# Patient Record
Sex: Female | Born: 1940 | Race: White | Hispanic: No | Marital: Married | Smoking: Never smoker
Health system: Southern US, Community
[De-identification: ages and names within clinical notes are randomized; demographics above are authoritative.]

## PROBLEM LIST (undated history)

## (undated) DIAGNOSIS — S72009A Fracture of unspecified part of neck of unspecified femur, initial encounter for closed fracture: Secondary | ICD-10-CM

## (undated) DIAGNOSIS — J80 Acute respiratory distress syndrome: Secondary | ICD-10-CM

## (undated) DIAGNOSIS — E119 Type 2 diabetes mellitus without complications: Secondary | ICD-10-CM

## (undated) DIAGNOSIS — J189 Pneumonia, unspecified organism: Secondary | ICD-10-CM

## (undated) DIAGNOSIS — A419 Sepsis, unspecified organism: Secondary | ICD-10-CM

## (undated) DIAGNOSIS — N183 Chronic kidney disease, stage 3 unspecified: Secondary | ICD-10-CM

## (undated) DIAGNOSIS — I10 Essential (primary) hypertension: Secondary | ICD-10-CM

## (undated) DIAGNOSIS — J9621 Acute and chronic respiratory failure with hypoxia: Secondary | ICD-10-CM

## (undated) DIAGNOSIS — S2239XA Fracture of one rib, unspecified side, initial encounter for closed fracture: Secondary | ICD-10-CM

## (undated) HISTORY — PX: RIB FRACTURE SURGERY: SHX2358

## (undated) HISTORY — PX: HIP FRACTURE SURGERY: SHX118

## (undated) HISTORY — PX: OTHER SURGICAL HISTORY: SHX169

---

## 2004-03-04 ENCOUNTER — Emergency Department (HOSPITAL_COMMUNITY): Admission: EM | Admit: 2004-03-04 | Discharge: 2004-03-04 | Payer: Self-pay | Admitting: Emergency Medicine

## 2017-10-26 ENCOUNTER — Other Ambulatory Visit (HOSPITAL_COMMUNITY): Payer: Self-pay

## 2017-10-26 ENCOUNTER — Inpatient Hospital Stay
Admission: AD | Admit: 2017-10-26 | Discharge: 2017-12-07 | Disposition: E | Payer: Medicare Other | Source: Ambulatory Visit | Attending: Internal Medicine | Admitting: Internal Medicine

## 2017-10-26 ENCOUNTER — Ambulatory Visit (HOSPITAL_COMMUNITY)
Admission: AD | Admit: 2017-10-26 | Discharge: 2017-10-26 | Disposition: A | Discharge status: Home / Self Care | Payer: Self-pay | Source: Other Acute Inpatient Hospital | Attending: Internal Medicine | Admitting: Internal Medicine

## 2017-10-26 DIAGNOSIS — Z0189 Encounter for other specified special examinations: Secondary | ICD-10-CM

## 2017-10-26 DIAGNOSIS — J9690 Respiratory failure, unspecified, unspecified whether with hypoxia or hypercapnia: Secondary | ICD-10-CM | POA: Insufficient documentation

## 2017-10-26 DIAGNOSIS — R140 Abdominal distension (gaseous): Secondary | ICD-10-CM

## 2017-10-26 DIAGNOSIS — J9621 Acute and chronic respiratory failure with hypoxia: Secondary | ICD-10-CM

## 2017-10-26 DIAGNOSIS — J811 Chronic pulmonary edema: Secondary | ICD-10-CM

## 2017-10-26 DIAGNOSIS — N183 Chronic kidney disease, stage 3 unspecified: Secondary | ICD-10-CM | POA: Diagnosis present

## 2017-10-26 DIAGNOSIS — K567 Ileus, unspecified: Secondary | ICD-10-CM

## 2017-10-26 DIAGNOSIS — J189 Pneumonia, unspecified organism: Secondary | ICD-10-CM

## 2017-10-26 DIAGNOSIS — J80 Acute respiratory distress syndrome: Secondary | ICD-10-CM

## 2017-10-26 DIAGNOSIS — A419 Sepsis, unspecified organism: Secondary | ICD-10-CM | POA: Diagnosis present

## 2017-10-26 DIAGNOSIS — Z931 Gastrostomy status: Secondary | ICD-10-CM

## 2017-10-26 HISTORY — DX: Sepsis, unspecified organism: A41.9

## 2017-10-26 HISTORY — DX: Essential (primary) hypertension: I10

## 2017-10-26 HISTORY — DX: Fracture of unspecified part of neck of unspecified femur, initial encounter for closed fracture: S72.009A

## 2017-10-26 HISTORY — DX: Pneumonia, unspecified organism: J18.9

## 2017-10-26 HISTORY — DX: Chronic kidney disease, stage 3 unspecified: N18.30

## 2017-10-26 HISTORY — DX: Fracture of one rib, unspecified side, initial encounter for closed fracture: S22.39XA

## 2017-10-26 HISTORY — DX: Acute respiratory distress syndrome: J80

## 2017-10-26 HISTORY — DX: Type 2 diabetes mellitus without complications: E11.9

## 2017-10-26 HISTORY — DX: Acute and chronic respiratory failure with hypoxia: J96.21

## 2017-10-26 HISTORY — DX: Chronic kidney disease, stage 3 (moderate): N18.3

## 2017-10-26 LAB — BLOOD GAS, ARTERIAL
Acid-Base Excess: 0.6 mmol/L (ref 0.0–2.0)
BICARBONATE: 26.2 mmol/L (ref 20.0–28.0)
FIO2: 50
LHR: 18 {breaths}/min
O2 SAT: 85.6 %
PATIENT TEMPERATURE: 98.6
PEEP: 5 cmH2O
PH ART: 7.309 — AB (ref 7.350–7.450)
VT: 400 mL
pCO2 arterial: 53.8 mmHg — ABNORMAL HIGH (ref 32.0–48.0)
pO2, Arterial: 52.1 mmHg — ABNORMAL LOW (ref 83.0–108.0)

## 2017-10-27 ENCOUNTER — Encounter: Payer: Self-pay | Admitting: Internal Medicine

## 2017-10-27 DIAGNOSIS — A419 Sepsis, unspecified organism: Secondary | ICD-10-CM | POA: Diagnosis not present

## 2017-10-27 DIAGNOSIS — J189 Pneumonia, unspecified organism: Secondary | ICD-10-CM | POA: Diagnosis not present

## 2017-10-27 DIAGNOSIS — N183 Chronic kidney disease, stage 3 unspecified: Secondary | ICD-10-CM | POA: Diagnosis present

## 2017-10-27 DIAGNOSIS — J80 Acute respiratory distress syndrome: Secondary | ICD-10-CM | POA: Diagnosis not present

## 2017-10-27 DIAGNOSIS — J9621 Acute and chronic respiratory failure with hypoxia: Secondary | ICD-10-CM | POA: Diagnosis present

## 2017-10-27 LAB — URINALYSIS, ROUTINE W REFLEX MICROSCOPIC
BILIRUBIN URINE: NEGATIVE
Glucose, UA: NEGATIVE mg/dL
Ketones, ur: NEGATIVE mg/dL
LEUKOCYTES UA: NEGATIVE
NITRITE: NEGATIVE
Protein, ur: 30 mg/dL — AB
Specific Gravity, Urine: 1.012 (ref 1.005–1.030)
pH: 5 (ref 5.0–8.0)

## 2017-10-27 LAB — COMPREHENSIVE METABOLIC PANEL
ALK PHOS: 67 U/L (ref 38–126)
ALT: 27 U/L (ref 14–54)
ANION GAP: 12 (ref 5–15)
AST: 22 U/L (ref 15–41)
Albumin: 2.1 g/dL — ABNORMAL LOW (ref 3.5–5.0)
BILIRUBIN TOTAL: 1 mg/dL (ref 0.3–1.2)
BUN: 61 mg/dL — ABNORMAL HIGH (ref 6–20)
CALCIUM: 8 mg/dL — AB (ref 8.9–10.3)
CO2: 26 mmol/L (ref 22–32)
Chloride: 107 mmol/L (ref 101–111)
Creatinine, Ser: 1.37 mg/dL — ABNORMAL HIGH (ref 0.44–1.00)
GFR calc non Af Amer: 36 mL/min — ABNORMAL LOW (ref >60)
GFR, EST AFRICAN AMERICAN: 42 mL/min — AB (ref >60)
Glucose, Bld: 153 mg/dL — ABNORMAL HIGH (ref 65–99)
Potassium: 4 mmol/L (ref 3.5–5.1)
SODIUM: 145 mmol/L (ref 135–145)
TOTAL PROTEIN: 4.9 g/dL — AB (ref 6.5–8.1)

## 2017-10-27 LAB — PROTIME-INR
INR: 1.33
Prothrombin Time: 16.4 seconds — ABNORMAL HIGH (ref 11.4–15.2)

## 2017-10-27 LAB — CBC WITH DIFFERENTIAL/PLATELET
Basophils Absolute: 0 10*3/uL (ref 0.0–0.1)
Basophils Relative: 0 %
Eosinophils Absolute: 0.1 10*3/uL (ref 0.0–0.7)
Eosinophils Relative: 0 %
HEMATOCRIT: 23 % — AB (ref 36.0–46.0)
Hemoglobin: 7.2 g/dL — ABNORMAL LOW (ref 12.0–15.0)
LYMPHS PCT: 5 %
Lymphs Abs: 0.7 10*3/uL (ref 0.7–4.0)
MCH: 28.9 pg (ref 26.0–34.0)
MCHC: 31.3 g/dL (ref 30.0–36.0)
MCV: 92.4 fL (ref 78.0–100.0)
MONO ABS: 1.1 10*3/uL — AB (ref 0.1–1.0)
MONOS PCT: 8 %
NEUTROS ABS: 12.5 10*3/uL — AB (ref 1.7–7.7)
Neutrophils Relative %: 87 %
Platelets: 303 10*3/uL (ref 150–400)
RBC: 2.49 MIL/uL — ABNORMAL LOW (ref 3.87–5.11)
RDW: 17.7 % — ABNORMAL HIGH (ref 11.5–15.5)
WBC: 14.3 10*3/uL — ABNORMAL HIGH (ref 4.0–10.5)

## 2017-10-27 LAB — HEMOGLOBIN A1C
HEMOGLOBIN A1C: 6.1 % — AB (ref 4.8–5.6)
Mean Plasma Glucose: 128.37 mg/dL

## 2017-10-27 LAB — ABO/RH: ABO/RH(D): A POS

## 2017-10-27 LAB — PREPARE RBC (CROSSMATCH)

## 2017-10-27 LAB — MAGNESIUM: Magnesium: 1.8 mg/dL (ref 1.7–2.4)

## 2017-10-27 LAB — PHOSPHORUS: PHOSPHORUS: 4.1 mg/dL (ref 2.5–4.6)

## 2017-10-27 LAB — TSH: TSH: 0.91 u[IU]/mL (ref 0.350–4.500)

## 2017-10-27 NOTE — Consult Note (Signed)
Pulmonary Critical Care Medicine [facility] PULMONARY SERVICE  Date of Service:  October 27, 2017   pulmonary consult note   Jean Chang  ZOX:[id number]  DOB: 06-28-1941   DOA: 10/15/2017  Referring Physician: Carron Curie, MD   HPI: Jean Chang is a 77 y.o. female seen for follow up of Acute on Chronic Respiratory Failure.  Patient has multiple medical problems including chronic kidney disease acute anemia acute renal failure sepsis secondary to pneumonia status post hip and rib fractures.  Patient has been having some history of falls.  The patient came into the hospital she was found confused sitting on the couch in complaining about having some pain in her left ribs.  When she was seen in the emergency room she had an elevated blood pressure no fevers were noted.  The patient was able to walk at that time.  Head CT was done which did not show any acute intracranial abnormalities.  Chest x-ray was showing multiple rib fractures on the left side.  In addition to chest x-ray showed a right upper lobe infiltrate.  Patient had respiratory distress was placed on BiPAP at the time.  Patient however decompensated and had to be intubated.  She underwent a bronchoscopy and was found to have mucus plugs and some old blood.  She underwent lavage and address of pulmonary toilet.  Eventually weaning attempts were made however chest x-ray showed persistence of infiltrates including now the left upper lobe.  Her oxygen requirements continued to be high and she was transferred to our facility for further management and weaning.  At the time of evaluation she is on assist-control mode and endotracheally intubated.  She was also receiving blood for low hemoglobin.  Her daughter states that she has not had endoscopy done apparently   Review of Systems:   ROS performed and is unremarkable other than noted above.  Past Medical History:  Diagnosis Date  . Acute on chronic respiratory failure  with hypoxia (HCC)   . Chronic kidney disease, stage 3 (HCC)   . Diabetes mellitus (HCC)   . Healthcare-associated pneumonia   . Hip fracture (HCC)   . Hypertension   . Pneumonia   . Rib fracture   . Sepsis [facility])     Past Surgical History:  Procedure Laterality Date  .  eye injection    . HIP FRACTURE SURGERY    . RIB FRACTURE SURGERY      Social History:    reports that she has never smoked. She has never used smokeless tobacco. She reports that she drank alcohol. She reports that she has current or past drug history.  Family History: Non-Contributory to the present illness  Allergies not on file  Medications:  Reviewed on Rounds  Physical Exam:  Vitals:  Temperature 99.4 degrees pulse 124 respiratory 20 blood pressure 119/56 saturations 94%  Ventilator Settings  Mode of ventilation assist-control FiO2 40% tidal volume 468 cc peep of 8  . General: Comfortable at this time . Eyes: Grossly normal lids, irises & conjunctiva . ENT: grossly tongue is normal . Neck: no obvious mass . Cardiovascular:  S1-S2 normal tachycardic no gallop or rub . Respiratory:  Coarse breath sounds scattered rhonchi . Abdomen:  Soft nontender nondistended . Skin: no rash seen on limited exam . Musculoskeletal: not rigid . Psychiatric:unable to assess . Neurologic: no seizure no involuntary movements          Labs on Admission:  Basic Metabolic Panel: Recent Labs  Lab 10/27/17 0512  NA 145  K 4.0  CL 107  CO2 26  GLUCOSE 153*  BUN 61*  CREATININE 1.37*  CALCIUM 8.0*  MG 1.8  PHOS 4.1    Liver Function Tests: Recent Labs  Lab 10/27/17 0512  AST 22  ALT 27  ALKPHOS 67  BILITOT 1.0  PROT 4.9*  ALBUMIN 2.1*   No results for input(s): LIPASE, AMYLASE in the last 168 hours. No results for input(s): AMMONIA in the last 168 hours.  CBC: Recent Labs  Lab 10/27/17 0512  WBC 14.3*  NEUTROABS 12.5*  HGB 7.2*  HCT 23.0*  MCV 92.4  PLT 303    Cardiac Enzymes: No  results for input(s): CKTOTAL, CKMB, CKMBINDEX, TROPONINI in the last 168 hours.  BNP (last 3 results) No results for input(s): BNP in the last 8760 hours.  ProBNP (last 3 results) No results for input(s): PROBNP in the last 8760 hours.   Radiological Exams on Admission: Dg Chest Port 1 View  Result Date: 11/01/2017 CLINICAL DATA:  Intubation EXAM: PORTABLE CHEST 1 VIEW COMPARISON:  Portable exam 1833 hours without priors for comparison FINDINGS: Tip of endotracheal tube projects 3.8 cm above carina. Nasogastric tube extends into stomach. RIGHT arm PICC line tip projects over SVC. Enlargement of cardiac silhouette. Atherosclerotic calcification aorta. Patchy BILATERAL airspace infiltrates greater in upper lobes consistent with multifocal pneumonia. No pneumothorax. Question small RIGHT pleural effusion. IMPRESSION: Patchy BILATERAL airspace infiltrates greater in upper lobes favoring multifocal pneumonia. Electronically Signed   By: Ulyses SouthwardMark  Boles M.D.   On: 10/16/2017 19:16   Dg Abd Portable 1v  Result Date: 10/11/2017 CLINICAL DATA:  OG tube placement EXAM: PORTABLE ABDOMEN - 1 VIEW COMPARISON:  None. FINDINGS: Esophageal tube tip and side-port project over proximal stomach. Overall nonobstructed gas pattern with scattered small and large bowel gas. Calcified phleboliths in the pelvis. Partially visualized hardware in the proximal left femur. IMPRESSION: Esophageal tube tip overlies the proximal stomach. Electronically Signed   By: Jasmine PangKim  Fujinaga M.D.   On: 10/23/2017 19:14       Assessment/Plan Active Problems:   Acute on chronic respiratory failure with hypoxia (HCC)   Sepsis (HCC)   Chronic kidney disease, stage 3 (HCC)   Healthcare-associated pneumonia   1.  acute on chronic Respiratory failure with hypoxia at this time patient is on full vent support she has been on assist-control mode RSBI and mechanics 7 fairly poor so therefore we have not been able to wean her.  She was attempted  on pressure support however she failed.  We will continue to reassess and try to wean again in the morning. 2. Sepsis patient has a history of pneumonia probably hospital-acquired at this point she is going to continue with IV antibiotics these were reviewed with the primary care team.  In addition she has had some issues with blood pressure and she has been requiring pressors will continue to monitor her pressures closely 3. Acute renal failure secondary to acute tubular necrosis patient is going to be continued on fluid within monitor her labs very closely.  We will continue with supportive care 4. Healthcare associated pneumonia as above patient is on antibiotics need to continue to monitor.  I think she will more than likely and needing a tracheostomy as currently she is not ready for weaning      I have personally seen and evaluated the patient, evaluated laboratory and imaging results, formulated the assessment and plan and placed orders. The Patient requires  high complexity decision making for assessment and support.  Case was discussed on Rounds with the Respiratory Therapy Staff  time spent 70 min   Yevonne Pax, MD [facility] Pulmonary Critical Care Medicine Sleep Medicine

## 2017-10-28 DIAGNOSIS — A419 Sepsis, unspecified organism: Secondary | ICD-10-CM | POA: Diagnosis not present

## 2017-10-28 DIAGNOSIS — J189 Pneumonia, unspecified organism: Secondary | ICD-10-CM | POA: Diagnosis not present

## 2017-10-28 DIAGNOSIS — J9621 Acute and chronic respiratory failure with hypoxia: Secondary | ICD-10-CM | POA: Diagnosis not present

## 2017-10-28 DIAGNOSIS — N183 Chronic kidney disease, stage 3 (moderate): Secondary | ICD-10-CM | POA: Diagnosis not present

## 2017-10-28 LAB — CBC
HCT: 26.8 % — ABNORMAL LOW (ref 36.0–46.0)
Hemoglobin: 8.4 g/dL — ABNORMAL LOW (ref 12.0–15.0)
MCH: 28.9 pg (ref 26.0–34.0)
MCHC: 31.3 g/dL (ref 30.0–36.0)
MCV: 92.1 fL (ref 78.0–100.0)
PLATELETS: 341 10*3/uL (ref 150–400)
RBC: 2.91 MIL/uL — AB (ref 3.87–5.11)
RDW: 17.6 % — ABNORMAL HIGH (ref 11.5–15.5)
WBC: 16.5 10*3/uL — AB (ref 4.0–10.5)

## 2017-10-28 LAB — RENAL FUNCTION PANEL
Albumin: 2 g/dL — ABNORMAL LOW (ref 3.5–5.0)
Anion gap: 12 (ref 5–15)
BUN: 58 mg/dL — ABNORMAL HIGH (ref 6–20)
CHLORIDE: 106 mmol/L (ref 101–111)
CO2: 27 mmol/L (ref 22–32)
CREATININE: 1.28 mg/dL — AB (ref 0.44–1.00)
Calcium: 7.8 mg/dL — ABNORMAL LOW (ref 8.9–10.3)
GFR, EST AFRICAN AMERICAN: 45 mL/min — AB (ref >60)
GFR, EST NON AFRICAN AMERICAN: 39 mL/min — AB (ref >60)
Glucose, Bld: 254 mg/dL — ABNORMAL HIGH (ref 65–99)
POTASSIUM: 3.6 mmol/L (ref 3.5–5.1)
Phosphorus: 3.2 mg/dL (ref 2.5–4.6)
Sodium: 145 mmol/L (ref 135–145)

## 2017-10-28 LAB — URINE CULTURE: CULTURE: NO GROWTH

## 2017-10-28 LAB — TYPE AND SCREEN
ABO/RH(D): A POS
Antibody Screen: NEGATIVE
UNIT DIVISION: 0

## 2017-10-28 LAB — BPAM RBC
Blood Product Expiration Date: 201903262359
ISSUE DATE / TIME: 201903211330
Unit Type and Rh: 6200

## 2017-10-28 LAB — MAGNESIUM: Magnesium: 1.8 mg/dL (ref 1.7–2.4)

## 2017-10-28 LAB — BRAIN NATRIURETIC PEPTIDE: B Natriuretic Peptide: 1463.1 pg/mL — ABNORMAL HIGH (ref 0.0–100.0)

## 2017-10-28 LAB — LACTIC ACID, PLASMA: LACTIC ACID, VENOUS: 0.9 mmol/L (ref 0.5–1.9)

## 2017-10-28 NOTE — Progress Notes (Signed)
[facility]  Date of Service: October 28, 2017  Follow Up Progress Note   Tarry KosBetty L Prestwood  WGN:[id number]  DOB: April 14, 1941   DOA: 10/24/2017  Referring Physician: Carron CurieAli Hijazi, MD   HPI: Tarry KosBetty L Westling is a 77 y.o. female seen for follow up of Acute on Chronic Respiratory Failure.  Patient remains on the ventilator she is orally intubated.  Has not been able to do much in the way of weaning.  She remains critically ill at this stage.  Currently is on 40% oxygen with saturations of 94%.  She will need a tracheostomy and therefore we will place a consultation and for ENT to see the patient.  She received blood yesterday follow-up hemoglobin noted.  As noted previously she may need GI consultation   Review of Systems:   ROS performed and is unremarkable other than noted above.  Past Medical History:  Diagnosis Date  . Acute on chronic respiratory failure with hypoxia (HCC)   . Chronic kidney disease, stage 3 (HCC)   . Diabetes mellitus (HCC)   . Healthcare-associated pneumonia   . Hip fracture (HCC)   . Hypertension   . Pneumonia   . Rib fracture   . Sepsis [facility](HCC)     Past Surgical History:  Procedure Laterality Date  .  eye injection    . HIP FRACTURE SURGERY    . RIB FRACTURE SURGERY      Social History:    reports that she has never smoked. She has never used smokeless tobacco. She reports that she drank alcohol. She reports that she has current or past drug history.  Family History: Non-Contributory to the present illness  Allergies not on file  Medications:  Reviewed on Rounds  Physical Exam:  Vitals: Temperature 98.7 pulse 99 respiratory rate 20 blood pressure 136/75 saturations 94%  Ventilator Settings mode of ventilation assist control FiO2 40% tidal volume 408 PEEP 8  . General: Comfortable at this time . Eyes: Grossly normal lids, irises & conjunctiva . ENT: grossly tongue is  normal . Neck: no obvious mass . Cardiovascular: S1-S2 normal no gallop . Respiratory: No rhonchi expansion is equal . Abdomen: Soft and nontender . Skin: no rash seen on limited exam . Musculoskeletal: not rigid . Psychiatric:unable to assess . Neurologic: no seizure no involuntary movements          Labs on Admission:  Basic Metabolic Panel: Recent Labs  Lab 10/27/17 0512 10/28/17 0652  NA 145 145  K 4.0 3.6  CL 107 106  CO2 26 27  GLUCOSE 153* 254*  BUN 61* 58*  CREATININE 1.37* 1.28*  CALCIUM 8.0* 7.8*  MG 1.8 1.8  PHOS 4.1 3.2    Liver Function Tests: Recent Labs  Lab 10/27/17 0512 10/28/17 0652  AST 22  --   ALT 27  --   ALKPHOS 67  --   BILITOT 1.0  --   PROT 4.9*  --   ALBUMIN 2.1* 2.0*   No results for input(s): LIPASE, AMYLASE in the last 168 hours. No results for input(s): AMMONIA in the last 168 hours.  CBC: Recent Labs  Lab 10/27/17 0512  WBC 14.3*  NEUTROABS 12.5*  HGB 7.2*  HCT 23.0*  MCV 92.4  PLT 303    Cardiac Enzymes: No results for input(s): CKTOTAL, CKMB, CKMBINDEX, TROPONINI in the last 168 hours.  BNP (last 3 results) Recent Labs    10/28/17 0652  BNP 1,463.1*  ProBNP (last 3 results) No results for input(s): PROBNP in the last 8760 hours.   Radiological Exams on Admission: Dg Chest Port 1 View  Result Date: 10/28/17 CLINICAL DATA:  Intubation EXAM: PORTABLE CHEST 1 VIEW COMPARISON:  Portable exam 1833 hours without priors for comparison FINDINGS: Tip of endotracheal tube projects 3.8 cm above carina. Nasogastric tube extends into stomach. RIGHT arm PICC line tip projects over SVC. Enlargement of cardiac silhouette. Atherosclerotic calcification aorta. Patchy BILATERAL airspace infiltrates greater in upper lobes consistent with multifocal pneumonia. No pneumothorax. Question small RIGHT pleural effusion. IMPRESSION: Patchy BILATERAL airspace infiltrates greater in upper lobes favoring multifocal pneumonia.  Electronically Signed   By: Ulyses Southward M.D.   On: 2017/10/28 19:16   Dg Abd Portable 1v  Result Date: 10/28/17 CLINICAL DATA:  OG tube placement EXAM: PORTABLE ABDOMEN - 1 VIEW COMPARISON:  None. FINDINGS: Esophageal tube tip and side-port project over proximal stomach. Overall nonobstructed gas pattern with scattered small and large bowel gas. Calcified phleboliths in the pelvis. Partially visualized hardware in the proximal left femur. IMPRESSION: Esophageal tube tip overlies the proximal stomach. Electronically Signed   By: Jasmine Pang M.D.   On: 10/28/17 19:14       Assessment/Plan Active Problems:   Acute on chronic respiratory failure with hypoxia (HCC)   Sepsis (HCC)   Chronic kidney disease, stage 3 (HCC)   Healthcare-associated pneumonia   1. Acute on chronic respiratory failure with hypoxia at this time patient seen full vent support remains on 40% oxygen.  Saturations are acceptable.  She is not able to wean at this time.  She will likely require prolonged mechanical ventilation and therefore a tracheostomy consult will be placed 2. Sepsis patient has been requiring pressors were weaned off as tolerated.  We will continue with antibiotics as ordered 3. Chronic kidney disease stage III we will continue with supportive care follow labs 4. Healthcare associated pneumonia patient has been treated with antibiotics which will be continued      I have personally seen and evaluated the patient, evaluated laboratory and imaging results, formulated the assessment and plan and placed orders. The Patient requires high complexity decision making for assessment and support.  Case was discussed on Rounds with the Respiratory Therapy Staff Time spent 35 minutes  Yevonne Pax, MD [facility] Pulmonary Critical Care Medicine Sleep Medicine

## 2017-10-29 LAB — RENAL FUNCTION PANEL
ALBUMIN: 1.9 g/dL — AB (ref 3.5–5.0)
ANION GAP: 13 (ref 5–15)
BUN: 58 mg/dL — ABNORMAL HIGH (ref 6–20)
CALCIUM: 7.6 mg/dL — AB (ref 8.9–10.3)
CO2: 29 mmol/L (ref 22–32)
Chloride: 103 mmol/L (ref 101–111)
Creatinine, Ser: 1.21 mg/dL — ABNORMAL HIGH (ref 0.44–1.00)
GFR calc non Af Amer: 42 mL/min — ABNORMAL LOW (ref >60)
GFR, EST AFRICAN AMERICAN: 48 mL/min — AB (ref >60)
GLUCOSE: 88 mg/dL (ref 65–99)
PHOSPHORUS: 2.6 mg/dL (ref 2.5–4.6)
POTASSIUM: 3.5 mmol/L (ref 3.5–5.1)
SODIUM: 145 mmol/L (ref 135–145)

## 2017-10-29 LAB — MAGNESIUM: Magnesium: 1.7 mg/dL (ref 1.7–2.4)

## 2017-10-29 LAB — CBC
HEMATOCRIT: 24.4 % — AB (ref 36.0–46.0)
HEMOGLOBIN: 8 g/dL — AB (ref 12.0–15.0)
MCH: 30.7 pg (ref 26.0–34.0)
MCHC: 32.8 g/dL (ref 30.0–36.0)
MCV: 93.5 fL (ref 78.0–100.0)
Platelets: 317 10*3/uL (ref 150–400)
RBC: 2.61 MIL/uL — AB (ref 3.87–5.11)
RDW: 17.9 % — ABNORMAL HIGH (ref 11.5–15.5)
WBC: 15.3 10*3/uL — ABNORMAL HIGH (ref 4.0–10.5)

## 2017-10-29 LAB — CULTURE, RESPIRATORY W GRAM STAIN: Culture: NORMAL

## 2017-10-29 LAB — CULTURE, RESPIRATORY

## 2017-10-30 ENCOUNTER — Other Ambulatory Visit (HOSPITAL_COMMUNITY): Payer: Self-pay

## 2017-10-30 LAB — CBC
HCT: 25.6 % — ABNORMAL LOW (ref 36.0–46.0)
HEMOGLOBIN: 8.1 g/dL — AB (ref 12.0–15.0)
MCH: 30.1 pg (ref 26.0–34.0)
MCHC: 31.6 g/dL (ref 30.0–36.0)
MCV: 95.2 fL (ref 78.0–100.0)
PLATELETS: 339 10*3/uL (ref 150–400)
RBC: 2.69 MIL/uL — AB (ref 3.87–5.11)
RDW: 18 % — ABNORMAL HIGH (ref 11.5–15.5)
WBC: 16.3 10*3/uL — AB (ref 4.0–10.5)

## 2017-10-30 LAB — RENAL FUNCTION PANEL
ANION GAP: 11 (ref 5–15)
Albumin: 1.9 g/dL — ABNORMAL LOW (ref 3.5–5.0)
BUN: 63 mg/dL — ABNORMAL HIGH (ref 6–20)
CALCIUM: 7.9 mg/dL — AB (ref 8.9–10.3)
CO2: 29 mmol/L (ref 22–32)
CREATININE: 1.32 mg/dL — AB (ref 0.44–1.00)
Chloride: 106 mmol/L (ref 101–111)
GFR, EST AFRICAN AMERICAN: 44 mL/min — AB (ref >60)
GFR, EST NON AFRICAN AMERICAN: 38 mL/min — AB (ref >60)
Glucose, Bld: 89 mg/dL (ref 65–99)
Phosphorus: 3.5 mg/dL (ref 2.5–4.6)
Potassium: 3.9 mmol/L (ref 3.5–5.1)
SODIUM: 146 mmol/L — AB (ref 135–145)

## 2017-10-30 LAB — MAGNESIUM: Magnesium: 1.8 mg/dL (ref 1.7–2.4)

## 2017-10-31 DIAGNOSIS — A419 Sepsis, unspecified organism: Secondary | ICD-10-CM | POA: Diagnosis not present

## 2017-10-31 DIAGNOSIS — N183 Chronic kidney disease, stage 3 (moderate): Secondary | ICD-10-CM | POA: Diagnosis not present

## 2017-10-31 DIAGNOSIS — J189 Pneumonia, unspecified organism: Secondary | ICD-10-CM | POA: Diagnosis not present

## 2017-10-31 DIAGNOSIS — J9621 Acute and chronic respiratory failure with hypoxia: Secondary | ICD-10-CM | POA: Diagnosis not present

## 2017-10-31 NOTE — Progress Notes (Signed)
Pulmonary Critical Care Medicine [facility]   PULMONARY SERVICE  PROGRESS NOTE  Date of Service:  October 31, 2017  Follow Up Progress Note   Jean Chang  ZOX:[id number]  DOB: [birth year]   DOA: 11-07-17  Referring Physician: Carron Curie, MD   HPI: Jean Chang is a 77 y.o. female seen for follow up of Acute on Chronic Respiratory Failure.  She remains orally intubated on the ventilator at this time currently on assist-control mode with 45% FiO2.  Patient has been having dark copious amounts of secretions.    Medications:  Reviewed on Rounds  Physical Exam:  Vitals:   Temperature 99.3 degrees pulse 106 respiratory 22 blood pressure 103/57 saturations 94%  Ventilator Settings  Mode of ventilation assist-control FiO2 45% tidal volume 775 with peep of 6  . General: Comfortable at this time . Eyes: Grossly normal lids, irises & conjunctiva . ENT: grossly tongue is normal . Neck: no obvious mass . Cardiovascular:  S1-S2 normal no gallop or rub . Respiratory:  Expansion equal no rhonchi . Abdomen:  Soft nontender . Skin: no rash seen on limited exam . Musculoskeletal: not rigid . Psychiatric:unable to assess . Neurologic: no seizure no involuntary movements          Labs on Admission:  Basic Metabolic Panel: Recent Labs  Lab 10/27/17 0512 10/28/17 0652 10/29/17 0547 10/30/17 0803  NA 145 145 145 146*  K 4.0 3.6 3.5 3.9  CL 107 106 103 106  CO2 26 27 29 29   GLUCOSE 153* 254* 88 89  BUN 61* 58* 58* 63*  CREATININE 1.37* 1.28* 1.21* 1.32*  CALCIUM 8.0* 7.8* 7.6* 7.9*  MG 1.8 1.8 1.7 1.8  PHOS 4.1 3.2 2.6 3.5    Liver Function Tests: Recent Labs  Lab 10/27/17 0512 10/28/17 0652 10/29/17 0547 10/30/17 0803  AST 22  --   --   --   ALT 27  --   --   --   ALKPHOS 67  --   --   --   BILITOT 1.0  --   --   --   PROT 4.9*  --   --   --   ALBUMIN 2.1* 2.0* 1.9* 1.9*   No results for input(s): LIPASE, AMYLASE in the last 168  hours. No results for input(s): AMMONIA in the last 168 hours.  CBC: Recent Labs  Lab 10/27/17 0512 10/28/17 0954 10/29/17 0547 10/30/17 0803  WBC 14.3* 16.5* 15.3* 16.3*  NEUTROABS 12.5*  --   --   --   HGB 7.2* 8.4* 8.0* 8.1*  HCT 23.0* 26.8* 24.4* 25.6*  MCV 92.4 92.1 93.5 95.2  PLT 303 341 317 339    Cardiac Enzymes: No results for input(s): CKTOTAL, CKMB, CKMBINDEX, TROPONINI in the last 168 hours.  BNP (last 3 results) Recent Labs    10/28/17 0652  BNP 1,463.1*    ProBNP (last 3 results) No results for input(s): PROBNP in the last 8760 hours.   Radiological Exams on Admission: Dg Chest Port 1 View  Result Date: 10/30/2017 CLINICAL DATA:  Respiratory failure EXAM: PORTABLE CHEST 1 VIEW COMPARISON:  None. FINDINGS: The ETT and right PICC line are stable. The NG tube terminates below today's film. No pneumothorax. Patchy bilateral pulmonary infiltrates are similar in the interval. The cardiomediastinal silhouette is unchanged. IMPRESSION: 1. Stable support apparatus as above. 2. Stable bilateral patchy pulmonary infiltrates. 3. No other changes. Electronically Signed   By: Gerome Sam III M.D  On: 10/30/2017 10:55       Assessment/Plan Active Problems:   Acute on chronic respiratory failure with hypoxia (HCC)   Sepsis (HCC)   Chronic kidney disease, stage 3 (HCC)   Healthcare-associated pneumonia   1.  acute on chronic Respiratory failure with hypoxia patient will continue with full vent support we have already placed a consult in for ENT take a look the patient for a tracheostomy.  We will continue with supportive care  2. Sepsis treated with antibiotics we will continue to follow blood pressure seems to be little bit better  3. Chronic kidney disease stage 3 continue to follow the labs continue with supportive care 4. Healthcare associated pneumonia treated with antibiotics last chest x-ray showing some patchy infiltrates 2      I have personally  seen and evaluated the patient, evaluated laboratory and imaging results, formulated the assessment and plan and placed orders. The Patient requires high complexity decision making for assessment and support.  Case was discussed on Rounds with the Respiratory Therapy Staff   Yevonne PaxSaadat A Levi Klaiber, MD [facility] Pulmonary Critical Care Medicine Sleep Medicine

## 2017-11-01 LAB — BASIC METABOLIC PANEL
Anion gap: 11 (ref 5–15)
BUN: 61 mg/dL — AB (ref 6–20)
CHLORIDE: 102 mmol/L (ref 101–111)
CO2: 30 mmol/L (ref 22–32)
CREATININE: 1.2 mg/dL — AB (ref 0.44–1.00)
Calcium: 7.8 mg/dL — ABNORMAL LOW (ref 8.9–10.3)
GFR calc Af Amer: 49 mL/min — ABNORMAL LOW (ref >60)
GFR calc non Af Amer: 42 mL/min — ABNORMAL LOW (ref >60)
GLUCOSE: 178 mg/dL — AB (ref 65–99)
Potassium: 3.4 mmol/L — ABNORMAL LOW (ref 3.5–5.1)
SODIUM: 143 mmol/L (ref 135–145)

## 2017-11-01 LAB — CBC
HCT: 23.5 % — ABNORMAL LOW (ref 36.0–46.0)
HEMOGLOBIN: 7.4 g/dL — AB (ref 12.0–15.0)
MCH: 29.7 pg (ref 26.0–34.0)
MCHC: 31.5 g/dL (ref 30.0–36.0)
MCV: 94.4 fL (ref 78.0–100.0)
Platelets: 335 10*3/uL (ref 150–400)
RBC: 2.49 MIL/uL — ABNORMAL LOW (ref 3.87–5.11)
RDW: 17.4 % — ABNORMAL HIGH (ref 11.5–15.5)
WBC: 14.9 10*3/uL — ABNORMAL HIGH (ref 4.0–10.5)

## 2017-11-01 LAB — CULTURE, BLOOD (ROUTINE X 2)
CULTURE: NO GROWTH
Culture: NO GROWTH
SPECIAL REQUESTS: ADEQUATE
Special Requests: ADEQUATE

## 2017-11-01 LAB — HEMOGLOBIN AND HEMATOCRIT, BLOOD
HEMATOCRIT: 24.6 % — AB (ref 36.0–46.0)
Hemoglobin: 7.5 g/dL — ABNORMAL LOW (ref 12.0–15.0)

## 2017-11-02 ENCOUNTER — Encounter (HOSPITAL_BASED_OUTPATIENT_CLINIC_OR_DEPARTMENT_OTHER): Payer: Self-pay

## 2017-11-02 ENCOUNTER — Other Ambulatory Visit (HOSPITAL_COMMUNITY): Payer: Self-pay

## 2017-11-02 DIAGNOSIS — N183 Chronic kidney disease, stage 3 (moderate): Secondary | ICD-10-CM | POA: Diagnosis not present

## 2017-11-02 DIAGNOSIS — J9621 Acute and chronic respiratory failure with hypoxia: Secondary | ICD-10-CM | POA: Diagnosis not present

## 2017-11-02 DIAGNOSIS — M7989 Other specified soft tissue disorders: Secondary | ICD-10-CM

## 2017-11-02 DIAGNOSIS — A419 Sepsis, unspecified organism: Secondary | ICD-10-CM | POA: Diagnosis not present

## 2017-11-02 DIAGNOSIS — J189 Pneumonia, unspecified organism: Secondary | ICD-10-CM | POA: Diagnosis not present

## 2017-11-02 LAB — CBC
HCT: 26.5 % — ABNORMAL LOW (ref 36.0–46.0)
HEMOGLOBIN: 8 g/dL — AB (ref 12.0–15.0)
MCH: 29 pg (ref 26.0–34.0)
MCHC: 30.2 g/dL (ref 30.0–36.0)
MCV: 96 fL (ref 78.0–100.0)
Platelets: 380 10*3/uL (ref 150–400)
RBC: 2.76 MIL/uL — AB (ref 3.87–5.11)
RDW: 17.6 % — ABNORMAL HIGH (ref 11.5–15.5)
WBC: 17.1 10*3/uL — ABNORMAL HIGH (ref 4.0–10.5)

## 2017-11-02 LAB — BASIC METABOLIC PANEL
Anion gap: 13 (ref 5–15)
BUN: 54 mg/dL — ABNORMAL HIGH (ref 6–20)
CALCIUM: 8.1 mg/dL — AB (ref 8.9–10.3)
CHLORIDE: 106 mmol/L (ref 101–111)
CO2: 25 mmol/L (ref 22–32)
CREATININE: 1.08 mg/dL — AB (ref 0.44–1.00)
GFR, EST AFRICAN AMERICAN: 55 mL/min — AB (ref >60)
GFR, EST NON AFRICAN AMERICAN: 48 mL/min — AB (ref >60)
Glucose, Bld: 149 mg/dL — ABNORMAL HIGH (ref 65–99)
Potassium: 4.1 mmol/L (ref 3.5–5.1)
SODIUM: 144 mmol/L (ref 135–145)

## 2017-11-02 NOTE — Progress Notes (Signed)
Pulmonary Critical Care Medicine [facility]   PULMONARY SERVICE  PROGRESS NOTE  Date of Service:  November 02, 2017  Jean Chang  ZOX:[id number]  DOB: 1940-11-03   DOA: 10/30/2017  Referring Physician: Carron Curie, MD  HPI: Jean Chang is a 77 y.o. female seen for follow up of Acute on Chronic Respiratory Failure.  She failed spontaneous breathing trial today tentatively scheduled for tracheostomy on Friday.  Remains on full vent support at this time assist-control mode with FiO2 45%  Medications: Reviewed on Rounds  Physical Exam:  Vitals:  Temperature 98.5 degrees pulse 103 respiratory 23 blood pressure 123/44 saturations 93%  Ventilator Settings  Mode of ventilation assist-control FiO2 45% tidal volume 585 peep 6  . General: Comfortable at this time . Eyes: Grossly normal lids, irises & conjunctiva . ENT: grossly tongue is normal . Neck: no obvious mass . Cardiovascular:  S1-S2 normal regular with no gallop . Respiratory:  No rhonchi . Abdomen:  Soft nontender . Skin: no rash seen on limited exam . Musculoskeletal: not rigid . Psychiatric:unable to assess . Neurologic: no seizure no involuntary movements         Labs on Admission:  Basic Metabolic Panel: Recent Labs  Lab 10/27/17 0512 10/28/17 0652 10/29/17 0547 10/30/17 0803 11/01/17 0733 11/02/17 0813  NA 145 145 145 146* 143 144  K 4.0 3.6 3.5 3.9 3.4* 4.1  CL 107 106 103 106 102 106  CO2 26 27 29 29 30 25   GLUCOSE 153* 254* 88 89 178* 149*  BUN 61* 58* 58* 63* 61* 54*  CREATININE 1.37* 1.28* 1.21* 1.32* 1.20* 1.08*  CALCIUM 8.0* 7.8* 7.6* 7.9* 7.8* 8.1*  MG 1.8 1.8 1.7 1.8  --   --   PHOS 4.1 3.2 2.6 3.5  --   --     Liver Function Tests: Recent Labs  Lab 10/27/17 0512 10/28/17 0652 10/29/17 0547 10/30/17 0803  AST 22  --   --   --   ALT 27  --   --   --   ALKPHOS 67  --   --   --   BILITOT 1.0  --   --   --   PROT 4.9*  --   --   --   ALBUMIN 2.1* 2.0* 1.9* 1.9*    No results for input(s): LIPASE, AMYLASE in the last 168 hours. No results for input(s): AMMONIA in the last 168 hours.  CBC: Recent Labs  Lab 10/27/17 0512 10/28/17 0954 10/29/17 0547 10/30/17 0803 11/01/17 0733 11/01/17 1514 11/02/17 0813  WBC 14.3* 16.5* 15.3* 16.3* 14.9*  --  17.1*  NEUTROABS 12.5*  --   --   --   --   --   --   HGB 7.2* 8.4* 8.0* 8.1* 7.4* 7.5* 8.0*  HCT 23.0* 26.8* 24.4* 25.6* 23.5* 24.6* 26.5*  MCV 92.4 92.1 93.5 95.2 94.4  --  96.0  PLT 303 341 317 339 335  --  380    Cardiac Enzymes: No results for input(s): CKTOTAL, CKMB, CKMBINDEX, TROPONINI in the last 168 hours.  BNP (last 3 results) Recent Labs    10/28/17 0652  BNP 1,463.1*    ProBNP (last 3 results) No results for input(s): PROBNP in the last 8760 hours.  Radiological Exams on Admission: Dg Abd 1 View  Result Date: 11/02/2017 CLINICAL DATA:  Abdominal distension, orogastric tube placement EXAM: ABDOMEN - 1 VIEW COMPARISON:  Radiographs of October 26, 2017. FINDINGS: Distal tip  of orogastric tube is seen in expected position of distal stomach. Air-filled large and small bowel is noted, but no abnormal distention is noted. Phleboliths are noted in the pelvis. IMPRESSION: Distal tip of orogastric tube seen in expected position of distal stomach. Air-filled large and small bowel is noted, but no definite abnormal dilatation is seen. Electronically Signed   By: Lupita RaiderJames  Green Jr, M.D.   On: 11/02/2017 16:53   Dg Chest Port 1 View  Result Date: 10/30/2017 CLINICAL DATA:  Respiratory failure EXAM: PORTABLE CHEST 1 VIEW COMPARISON:  None. FINDINGS: The ETT and right PICC line are stable. The NG tube terminates below today's film. No pneumothorax. Patchy bilateral pulmonary infiltrates are similar in the interval. The cardiomediastinal silhouette is unchanged. IMPRESSION: 1. Stable support apparatus as above. 2. Stable bilateral patchy pulmonary infiltrates. 3. No other changes. Electronically Signed    By: Gerome Samavid  Bohlken III M.D   On: 10/30/2017 10:55    Assessment/Plan Active Problems:   Acute on chronic respiratory failure with hypoxia (HCC)   Sepsis (HCC)   Chronic kidney disease, stage 3 (HCC)   Healthcare-associated pneumonia   1.  acute on chronic Respiratory failure with hypoxia right now patient is on full vent support has failed weaning at times will continue with supportive care reassess the RSBI mechanics in  2. Chronic kidney disease stage 3 continue to monitor the labs continue with supportive care 3. Healthcare associated pneumonia treated with antibiotics we will continue monitor 4. Sepsis treated hemodynamically stable   I have personally seen and evaluated the patient, evaluated laboratory and imaging results, formulated the assessment and plan and placed orders. The Patient requires high complexity decision making for assessment and support.  Case was discussed on Rounds with the Respiratory Therapy Staff  Yevonne PaxSaadat A Niaya Hickok, MD [facility] Pulmonary Critical Care Medicine Sleep Medicine

## 2017-11-02 NOTE — Progress Notes (Signed)
RUE venous duplex prelim: no evidence of DVT or SVT in visualized veins. Farrel DemarkJill Eunice, RDMS, RVT

## 2017-11-03 ENCOUNTER — Other Ambulatory Visit (HOSPITAL_COMMUNITY): Payer: Self-pay

## 2017-11-03 DIAGNOSIS — N183 Chronic kidney disease, stage 3 (moderate): Secondary | ICD-10-CM | POA: Diagnosis not present

## 2017-11-03 DIAGNOSIS — A419 Sepsis, unspecified organism: Secondary | ICD-10-CM | POA: Diagnosis not present

## 2017-11-03 DIAGNOSIS — J9621 Acute and chronic respiratory failure with hypoxia: Secondary | ICD-10-CM | POA: Diagnosis not present

## 2017-11-03 DIAGNOSIS — J189 Pneumonia, unspecified organism: Secondary | ICD-10-CM | POA: Diagnosis not present

## 2017-11-03 LAB — URINALYSIS, ROUTINE W REFLEX MICROSCOPIC
Bacteria, UA: NONE SEEN
Bilirubin Urine: NEGATIVE
GLUCOSE, UA: NEGATIVE mg/dL
Hgb urine dipstick: NEGATIVE
Ketones, ur: NEGATIVE mg/dL
Nitrite: NEGATIVE
PH: 7 (ref 5.0–8.0)
Protein, ur: NEGATIVE mg/dL
SPECIFIC GRAVITY, URINE: 1.01 (ref 1.005–1.030)

## 2017-11-03 LAB — C DIFFICILE QUICK SCREEN W PCR REFLEX
C DIFFICILE (CDIFF) INTERP: NOT DETECTED
C DIFFICILE (CDIFF) TOXIN: NEGATIVE
C DIFFICLE (CDIFF) ANTIGEN: NEGATIVE

## 2017-11-03 NOTE — Progress Notes (Signed)
[facility]   PULMONARY SERVICE  PROGRESS NOTE  Date of Service:  November 03, 2017  Jean Chang  ZOX:[id number]  DOB: 04-12-1941   DOA: 10/12/2017  Referring Physician: Carron CurieAli Hijazi, MD  HPI: Jean Chang is a 77 y.o. female seen for follow up of Acute on Chronic Respiratory Failure.  Remains on full vent support tracheostomy scheduled for Friday  Medications: Reviewed on Rounds  Physical Exam:  Vitals: Temperature 99.7 degrees pulse 110 respiratory 26 blood pressure 141/55 saturations 95%  Ventilator Settings  Mode of ventilation assist-control FiO2 45% tidal volume 471 peep 6  . General: Comfortable at this time . Eyes: Grossly normal lids, irises & conjunctiva . ENT: grossly tongue is normal . Neck: no obvious mass . Cardiovascular:  S1-S2 normal regular rhythm . Respiratory:  No rhonchi . Abdomen:  Soft nontender . Skin: no rash seen on limited exam . Musculoskeletal: not rigid . Psychiatric:unable to assess . Neurologic: no seizure no involuntary movements         Labs on Admission:  Basic Metabolic Panel: Recent Labs  Lab 10/28/17 0652 10/29/17 0547 10/30/17 0803 11/01/17 0733 11/02/17 0813  NA 145 145 146* 143 144  K 3.6 3.5 3.9 3.4* 4.1  CL 106 103 106 102 106  CO2 27 29 29 30 25   GLUCOSE 254* 88 89 178* 149*  BUN 58* 58* 63* 61* 54*  CREATININE 1.28* 1.21* 1.32* 1.20* 1.08*  CALCIUM 7.8* 7.6* 7.9* 7.8* 8.1*  MG 1.8 1.7 1.8  --   --   PHOS 3.2 2.6 3.5  --   --     Liver Function Tests: Recent Labs  Lab 10/28/17 0652 10/29/17 0547 10/30/17 0803  ALBUMIN 2.0* 1.9* 1.9*   No results for input(s): LIPASE, AMYLASE in the last 168 hours. No results for input(s): AMMONIA in the last 168 hours.  CBC: Recent Labs  Lab 10/28/17 0954 10/29/17 0547 10/30/17 0803 11/01/17 0733 11/01/17 1514 11/02/17 0813  WBC 16.5* 15.3* 16.3* 14.9*  --  17.1*  HGB 8.4* 8.0* 8.1* 7.4* 7.5* 8.0*  HCT  26.8* 24.4* 25.6* 23.5* 24.6* 26.5*  MCV 92.1 93.5 95.2 94.4  --  96.0  PLT 341 317 339 335  --  380    Cardiac Enzymes: No results for input(s): CKTOTAL, CKMB, CKMBINDEX, TROPONINI in the last 168 hours.  BNP (last 3 results) Recent Labs    10/28/17 0652  BNP 1,463.1*    ProBNP (last 3 results) No results for input(s): PROBNP in the last 8760 hours.  Radiological Exams on Admission: Dg Abd 1 View  Result Date: 11/02/2017 CLINICAL DATA:  Abdominal distension, orogastric tube placement EXAM: ABDOMEN - 1 VIEW COMPARISON:  Radiographs of October 26, 2017. FINDINGS: Distal tip of orogastric tube is seen in expected position of distal stomach. Air-filled large and small bowel is noted, but no abnormal distention is noted. Phleboliths are noted in the pelvis. IMPRESSION: Distal tip of orogastric tube seen in expected position of distal stomach. Air-filled large and small bowel is noted, but no definite abnormal dilatation is seen. Electronically Signed   By: Lupita RaiderJames  Green Jr, M.D.   On: 11/02/2017 16:53   Dg Chest Port 1 View  Result Date: 11/03/2017 CLINICAL DATA:  Pneumonia EXAM: PORTABLE CHEST 1 VIEW COMPARISON:  10/30/2017 FINDINGS: Endotracheal tube tip 5.7 cm above the carina NG tube within the stomach again noted. Removal of RIGHT-sided PICC line noted. A LEFT PICC line is noted  with tip overlying the MEDIAL UPPER LEFT arm. Diffuse bilateral airspace opacities are identified and not significantly changed. There is no evidence of pneumothorax. IMPRESSION: LEFT PICC line with tip overlying the MEDIAL UPPER LEFT arm and removal of RIGHT PICC line. Otherwise no significant change with continued diffuse bilateral airspace opacities. Electronically Signed   By: Harmon Pier M.D.   On: 11/03/2017 08:39    Assessment/Plan Active Problems:   Acute on chronic respiratory failure with hypoxia (HCC)   Sepsis (HCC)   Chronic kidney disease, stage 3 (HCC)   Healthcare-associated pneumonia   1.   acute on chronic Respiratory failure with hypoxia will continue with full vent support at this time patient will be trached 2.  chronic kidney disease stage 3 we will continue with present supportive care 3. Healthcare associated pneumonia treated with antibiotics 4. Sepsis treated   I have personally seen and evaluated the patient, evaluated laboratory and imaging results, formulated the assessment and plan and placed orders. The Patient requires high complexity decision making for assessment and support.  Case was discussed on Rounds with the Respiratory Therapy Staff  Yevonne Pax, MD [facility] Pulmonary Critical Care Medicine Sleep Medicine

## 2017-11-04 DIAGNOSIS — J189 Pneumonia, unspecified organism: Secondary | ICD-10-CM | POA: Diagnosis not present

## 2017-11-04 DIAGNOSIS — A419 Sepsis, unspecified organism: Secondary | ICD-10-CM | POA: Diagnosis not present

## 2017-11-04 DIAGNOSIS — N183 Chronic kidney disease, stage 3 (moderate): Secondary | ICD-10-CM | POA: Diagnosis not present

## 2017-11-04 DIAGNOSIS — J9621 Acute and chronic respiratory failure with hypoxia: Secondary | ICD-10-CM | POA: Diagnosis not present

## 2017-11-04 LAB — BASIC METABOLIC PANEL
Anion gap: 10 (ref 5–15)
BUN: 48 mg/dL — AB (ref 6–20)
CHLORIDE: 104 mmol/L (ref 101–111)
CO2: 27 mmol/L (ref 22–32)
CREATININE: 1.06 mg/dL — AB (ref 0.44–1.00)
Calcium: 7.9 mg/dL — ABNORMAL LOW (ref 8.9–10.3)
GFR calc Af Amer: 57 mL/min — ABNORMAL LOW (ref >60)
GFR calc non Af Amer: 49 mL/min — ABNORMAL LOW (ref >60)
Glucose, Bld: 196 mg/dL — ABNORMAL HIGH (ref 65–99)
POTASSIUM: 3.4 mmol/L — AB (ref 3.5–5.1)
Sodium: 141 mmol/L (ref 135–145)

## 2017-11-04 LAB — CBC
HEMATOCRIT: 21.5 % — AB (ref 36.0–46.0)
HEMOGLOBIN: 6.6 g/dL — AB (ref 12.0–15.0)
MCH: 29.9 pg (ref 26.0–34.0)
MCHC: 30.7 g/dL (ref 30.0–36.0)
MCV: 97.3 fL (ref 78.0–100.0)
Platelets: 355 10*3/uL (ref 150–400)
RBC: 2.21 MIL/uL — ABNORMAL LOW (ref 3.87–5.11)
RDW: 18 % — ABNORMAL HIGH (ref 11.5–15.5)
WBC: 13.4 10*3/uL — ABNORMAL HIGH (ref 4.0–10.5)

## 2017-11-04 LAB — OCCULT BLOOD X 1 CARD TO LAB, STOOL: Fecal Occult Bld: NEGATIVE

## 2017-11-04 LAB — PREPARE RBC (CROSSMATCH)

## 2017-11-04 LAB — MAGNESIUM: Magnesium: 2 mg/dL (ref 1.7–2.4)

## 2017-11-04 NOTE — Progress Notes (Signed)
[facility]   PULMONARY SERVICE  PROGRESS NOTE  Date of Service: November 04, 2017  Jean KosBetty L Chang  ZOX:[id number]  DOB: 06/16/1941   DOA: 10/18/2017  Referring Physician: Carron CurieAli Hijazi, MD  HPI: Jean Chang is a 77 y.o. female seen for follow up of Acute on Chronic Respiratory Failure.  Currently on full vent support patient supposed to have a tracheostomy.  She remains somewhat agitated remains endotracheally intubated.  Medications: Reviewed on Rounds  Physical Exam:  Vitals: Temperature 97.6 pulse 110 respiratory 29 blood pressure 156/59 saturations 93%  Ventilator Settings mode of ventilation assist control FiO2 45% tidal volume 5:36 PM 6  . General: Comfortable at this time . Eyes: Grossly normal lids, irises & conjunctiva . ENT: grossly tongue is normal . Neck: no obvious mass . Cardiovascular: S1-S2 normal regular rhythm no gallop . Respiratory: No rhonchi expansion is equal . Abdomen: Soft nontender nondistended . Skin: no rash seen on limited exam . Musculoskeletal: not rigid . Psychiatric:unable to assess . Neurologic: no seizure no involuntary movements         Labs on Admission:  Basic Metabolic Panel: Recent Labs  Lab 10/29/17 0547 10/30/17 0803 11/01/17 0733 11/02/17 0813 11/04/17 0735  NA 145 146* 143 144 141  K 3.5 3.9 3.4* 4.1 3.4*  CL 103 106 102 106 104  CO2 29 29 30 25 27   GLUCOSE 88 89 178* 149* 196*  BUN 58* 63* 61* 54* 48*  CREATININE 1.21* 1.32* 1.20* 1.08* 1.06*  CALCIUM 7.6* 7.9* 7.8* 8.1* 7.9*  MG 1.7 1.8  --   --  2.0  PHOS 2.6 3.5  --   --   --     Liver Function Tests: Recent Labs  Lab 10/29/17 0547 10/30/17 0803  ALBUMIN 1.9* 1.9*   No results for input(s): LIPASE, AMYLASE in the last 168 hours. No results for input(s): AMMONIA in the last 168 hours.  CBC: Recent Labs  Lab 10/29/17 0547 10/30/17 0803 11/01/17 0733 11/01/17 1514 11/02/17 0813 11/04/17 0735   WBC 15.3* 16.3* 14.9*  --  17.1* 13.4*  HGB 8.0* 8.1* 7.4* 7.5* 8.0* 6.6*  HCT 24.4* 25.6* 23.5* 24.6* 26.5* 21.5*  MCV 93.5 95.2 94.4  --  96.0 97.3  PLT 317 339 335  --  380 355    Cardiac Enzymes: No results for input(s): CKTOTAL, CKMB, CKMBINDEX, TROPONINI in the last 168 hours.  BNP (last 3 results) Recent Labs    10/28/17 0652  BNP 1,463.1*    ProBNP (last 3 results) No results for input(s): PROBNP in the last 8760 hours.  Radiological Exams on Admission: Dg Abd 1 View  Result Date: 11/02/2017 CLINICAL DATA:  Abdominal distension, orogastric tube placement EXAM: ABDOMEN - 1 VIEW COMPARISON:  Radiographs of October 26, 2017. FINDINGS: Distal tip of orogastric tube is seen in expected position of distal stomach. Air-filled large and small bowel is noted, but no abnormal distention is noted. Phleboliths are noted in the pelvis. IMPRESSION: Distal tip of orogastric tube seen in expected position of distal stomach. Air-filled large and small bowel is noted, but no definite abnormal dilatation is seen. Electronically Signed   By: Lupita RaiderJames  Green Jr, M.D.   On: 11/02/2017 16:53   Dg Chest Port 1 View  Result Date: 11/03/2017 CLINICAL DATA:  Pneumonia EXAM: PORTABLE CHEST 1 VIEW COMPARISON:  10/30/2017 FINDINGS: Endotracheal tube tip 5.7 cm above the carina NG tube within the stomach again noted. Removal of  RIGHT-sided PICC line noted. A LEFT PICC line is noted with tip overlying the MEDIAL UPPER LEFT arm. Diffuse bilateral airspace opacities are identified and not significantly changed. There is no evidence of pneumothorax. IMPRESSION: LEFT PICC line with tip overlying the MEDIAL UPPER LEFT arm and removal of RIGHT PICC line. Otherwise no significant change with continued diffuse bilateral airspace opacities. Electronically Signed   By: Harmon Pier M.D.   On: 11/03/2017 08:39    Assessment/Plan Active Problems:   Acute on chronic respiratory failure with hypoxia (HCC)   Sepsis (HCC)    Chronic kidney disease, stage 3 (HCC)   Healthcare-associated pneumonia   1. Acute on chronic respiratory failure with hypoxia doing fine we are still waiting for the trach to be done we will continue with supportive care 2. Sepsis treated with antibiotics we will continue to monitor 3. Chronic kidney disease stage III we will continue with supportive care 4. Healthcare associated pneumonia has been treated with antibiotics   I have personally seen and evaluated the patient, evaluated laboratory and imaging results, formulated the assessment and plan and placed orders. The Patient requires high complexity decision making for assessment and support.  Case was discussed on Rounds with the Respiratory Therapy Staff  Yevonne Pax, MD [facility] Pulmonary Critical Care Medicine Sleep Medicine

## 2017-11-05 ENCOUNTER — Other Ambulatory Visit (HOSPITAL_COMMUNITY): Payer: Self-pay

## 2017-11-05 LAB — MAGNESIUM: Magnesium: 2.1 mg/dL (ref 1.7–2.4)

## 2017-11-05 LAB — TYPE AND SCREEN
ABO/RH(D): A POS
Antibody Screen: NEGATIVE
UNIT DIVISION: 0
Unit division: 0

## 2017-11-05 LAB — BASIC METABOLIC PANEL
ANION GAP: 11 (ref 5–15)
BUN: 46 mg/dL — AB (ref 6–20)
CO2: 28 mmol/L (ref 22–32)
Calcium: 8 mg/dL — ABNORMAL LOW (ref 8.9–10.3)
Chloride: 104 mmol/L (ref 101–111)
Creatinine, Ser: 0.91 mg/dL (ref 0.44–1.00)
GFR calc Af Amer: >60 mL/min (ref >60)
GFR, EST NON AFRICAN AMERICAN: 59 mL/min — AB (ref >60)
GLUCOSE: 104 mg/dL — AB (ref 65–99)
Potassium: 3.8 mmol/L (ref 3.5–5.1)
Sodium: 143 mmol/L (ref 135–145)

## 2017-11-05 LAB — CBC
HCT: 31.8 % — ABNORMAL LOW (ref 36.0–46.0)
Hemoglobin: 9.9 g/dL — ABNORMAL LOW (ref 12.0–15.0)
MCH: 26.9 pg (ref 26.0–34.0)
MCHC: 31.1 g/dL (ref 30.0–36.0)
MCV: 86.4 fL (ref 78.0–100.0)
PLATELETS: 294 10*3/uL (ref 150–400)
RBC: 3.68 MIL/uL — ABNORMAL LOW (ref 3.87–5.11)
RDW: 25.3 % — ABNORMAL HIGH (ref 11.5–15.5)
WBC: 12.5 10*3/uL — AB (ref 4.0–10.5)

## 2017-11-05 LAB — BPAM RBC
BLOOD PRODUCT EXPIRATION DATE: 201904032359
Blood Product Expiration Date: 201904032359
ISSUE DATE / TIME: 201903292322
ISSUE DATE / TIME: 201903291402
UNIT TYPE AND RH: 6200
Unit Type and Rh: 6200

## 2017-11-05 LAB — CULTURE, RESPIRATORY W GRAM STAIN

## 2017-11-05 LAB — CULTURE, RESPIRATORY

## 2017-11-05 LAB — URINE CULTURE: CULTURE: NO GROWTH

## 2017-11-06 ENCOUNTER — Encounter: Payer: Self-pay | Admitting: Internal Medicine

## 2017-11-06 DIAGNOSIS — J811 Chronic pulmonary edema: Secondary | ICD-10-CM

## 2017-11-06 DIAGNOSIS — N183 Chronic kidney disease, stage 3 (moderate): Secondary | ICD-10-CM | POA: Diagnosis not present

## 2017-11-06 DIAGNOSIS — A419 Sepsis, unspecified organism: Secondary | ICD-10-CM | POA: Diagnosis not present

## 2017-11-06 DIAGNOSIS — J80 Acute respiratory distress syndrome: Secondary | ICD-10-CM

## 2017-11-06 DIAGNOSIS — J9621 Acute and chronic respiratory failure with hypoxia: Secondary | ICD-10-CM | POA: Diagnosis not present

## 2017-11-06 DIAGNOSIS — J189 Pneumonia, unspecified organism: Secondary | ICD-10-CM | POA: Diagnosis not present

## 2017-11-06 HISTORY — DX: Acute respiratory distress syndrome: J80

## 2017-11-06 LAB — BLOOD GAS, ARTERIAL
Acid-Base Excess: 8.7 mmol/L — ABNORMAL HIGH (ref 0.0–2.0)
Bicarbonate: 33.4 mmol/L — ABNORMAL HIGH (ref 20.0–28.0)
FIO2: 70
LHR: 20 {breaths}/min
O2 Saturation: 96.7 %
PATIENT TEMPERATURE: 98.6
PCO2 ART: 53.3 mmHg — AB (ref 32.0–48.0)
PEEP: 8 cmH2O
VT: 450 mL
pH, Arterial: 7.414 (ref 7.350–7.450)
pO2, Arterial: 83.9 mmHg (ref 83.0–108.0)

## 2017-11-06 LAB — BASIC METABOLIC PANEL
Anion gap: 11 (ref 5–15)
BUN: 44 mg/dL — ABNORMAL HIGH (ref 6–20)
CHLORIDE: 105 mmol/L (ref 101–111)
CO2: 29 mmol/L (ref 22–32)
CREATININE: 0.84 mg/dL (ref 0.44–1.00)
Calcium: 8 mg/dL — ABNORMAL LOW (ref 8.9–10.3)
Glucose, Bld: 50 mg/dL — ABNORMAL LOW (ref 65–99)
Potassium: 3.3 mmol/L — ABNORMAL LOW (ref 3.5–5.1)
SODIUM: 145 mmol/L (ref 135–145)

## 2017-11-06 NOTE — Progress Notes (Signed)
[facility]   PULMONARY SERVICE  PROGRESS NOTE  Date of Service: November 06, 2017  Jean Chang  VHQ:[id number]  DOB: [birth year]   DOA: 10/19/2017  Referring Physician: Carron CurieAli Hijazi, MD  HPI: Jean KosBetty L Jean Chang is a 77 y.o. female seen for follow up of Acute on Chronic Respiratory Failure.  Patient is on full vent support.  Patient's oxygen requirements have gone up.  Her pulmonary status is actually worsened.  Latest chest x-ray shows bilateral alveolar interstitial infiltrates consistent with pulmonary edema or ARDS patient needs to be kept on the dry side  Medications: Reviewed on Rounds  Physical Exam:  Vitals: Temperature 97.6 pulse 123 respiratory rate 20 saturations 94%  Ventilator Settings mode of ventilation assist control FiO2 75% tidal volume 348 PEEP 5  . General: Comfortable at this time . Eyes: Grossly normal lids, irises & conjunctiva . ENT: grossly tongue is normal . Neck: no obvious mass . Cardiovascular: S1-S2 normal no gallop . Respiratory: No rhonchi expansion is equal . Abdomen: Soft nontender . Skin: no rash seen on limited exam . Musculoskeletal: not rigid . Psychiatric:unable to assess . Neurologic: no seizure no involuntary movements         Labs on Admission:  Basic Metabolic Panel: Recent Labs  Lab 11/01/17 0733 11/02/17 0813 11/04/17 0735 11/05/17 0515 11/06/17 0522  NA 143 144 141 143 145  K 3.4* 4.1 3.4* 3.8 3.3*  CL 102 106 104 104 105  CO2 30 25 27 28 29   GLUCOSE 178* 149* 196* 104* 50*  BUN 61* 54* 48* 46* 44*  CREATININE 1.20* 1.08* 1.06* 0.91 0.84  CALCIUM 7.8* 8.1* 7.9* 8.0* 8.0*  MG  --   --  2.0 2.1  --     Liver Function Tests: No results for input(s): AST, ALT, ALKPHOS, BILITOT, PROT, ALBUMIN in the last 168 hours. No results for input(s): LIPASE, AMYLASE in the last 168 hours. No results for input(s): AMMONIA in the last 168 hours.  CBC: Recent Labs  Lab  11/01/17 0733 11/01/17 1514 11/02/17 0813 11/04/17 0735 11/05/17 0515  WBC 14.9*  --  17.1* 13.4* 12.5*  HGB 7.4* 7.5* 8.0* 6.6* 9.9*  HCT 23.5* 24.6* 26.5* 21.5* 31.8*  MCV 94.4  --  96.0 97.3 86.4  PLT 335  --  380 355 294    Cardiac Enzymes: No results for input(s): CKTOTAL, CKMB, CKMBINDEX, TROPONINI in the last 168 hours.  BNP (last 3 results) Recent Labs    10/28/17 0652  BNP 1,463.1*    ProBNP (last 3 results) No results for input(s): PROBNP in the last 8760 hours.  Radiological Exams on Admission: Dg Abd 1 View  Result Date: 11/02/2017 CLINICAL DATA:  Abdominal distension, orogastric tube placement EXAM: ABDOMEN - 1 VIEW COMPARISON:  Radiographs of October 26, 2017. FINDINGS: Distal tip of orogastric tube is seen in expected position of distal stomach. Air-filled large and small bowel is noted, but no abnormal distention is noted. Phleboliths are noted in the pelvis. IMPRESSION: Distal tip of orogastric tube seen in expected position of distal stomach. Air-filled large and small bowel is noted, but no definite abnormal dilatation is seen. Electronically Signed   By: Lupita RaiderJames  Green Jr, M.D.   On: 11/02/2017 16:53   Dg Chest Port 1 View  Result Date: 11/05/2017 CLINICAL DATA:  Respiratory failure EXAM: PORTABLE CHEST 1 VIEW COMPARISON:  11/03/2017 FINDINGS: Endotracheal tube with the tip 5.2 cm above the carina. Nasogastric tube coursing  below the diaphragm. Left-sided PICC line projecting over the axilla. Diffuse bilateral interstitial and alveolar airspace opacities. No pleural effusion or pneumothorax. Stable cardiomediastinal silhouette. No acute osseous abnormality. IMPRESSION: 1. Endotracheal tube in satisfactory position. 2. Left-sided PICC line with the tip projecting over the axilla. 3. Bilateral interstitial and alveolar airspace opacities which may reflect pulmonary edema versus multilobar pneumonia versus ARDS. Electronically Signed   By: Elige Ko   On: 11/05/2017  16:06   Dg Chest Port 1 View  Result Date: 11/03/2017 CLINICAL DATA:  Pneumonia EXAM: PORTABLE CHEST 1 VIEW COMPARISON:  10/30/2017 FINDINGS: Endotracheal tube tip 5.7 cm above the carina NG tube within the stomach again noted. Removal of RIGHT-sided PICC line noted. A LEFT PICC line is noted with tip overlying the MEDIAL UPPER LEFT arm. Diffuse bilateral airspace opacities are identified and not significantly changed. There is no evidence of pneumothorax. IMPRESSION: LEFT PICC line with tip overlying the MEDIAL UPPER LEFT arm and removal of RIGHT PICC line. Otherwise no significant change with continued diffuse bilateral airspace opacities. Electronically Signed   By: Harmon Pier M.D.   On: 11/03/2017 08:39    Assessment/Plan Active Problems:   Acute on chronic respiratory failure with hypoxia (HCC)   Sepsis (HCC)   Chronic kidney disease, stage 3 (HCC)   Healthcare-associated pneumonia   1. Acute on chronic respiratory failure with hypoxia patient will be continued on full support at this time has been on assist control mode patient's been requiring higher oxygen.  Chest x-ray showing more consistency with pulmonary edema or ARDS.  Would increase the PEEP today and reassess to try to wean FiO2 down 2. Sepsis syndrome patient seems to be hemodynamically stable not on pressors at this time.  Patient has been treated with antibiotics 3. Chronic kidney disease stage III labs improving we will continue to monitor 4. Healthcare associated pneumonia patient has been on antibiotics we will continue with supportive care follow-up x-rays looking worse 5. Possible ARDS need to keep patient a little bit on the dry side we will continue to follow hemodynamics as well as the renal status  Patient is critically ill and in danger of cardiac arrest she needs closer monitoring with advance monitoring techniques. Time spent 30 minutes critical care discussion on rounds   I have personally seen and evaluated  the patient, evaluated laboratory and imaging results, formulated the assessment and plan and placed orders. The Patient requires high complexity decision making for assessment and support.  Case was discussed on Rounds with the Respiratory Therapy Staff  Yevonne Pax, MD [facility] Pulmonary Critical Care Medicine Sleep Medicine

## 2017-11-07 ENCOUNTER — Other Ambulatory Visit (HOSPITAL_COMMUNITY): Payer: Self-pay

## 2017-11-07 DIAGNOSIS — J9621 Acute and chronic respiratory failure with hypoxia: Secondary | ICD-10-CM | POA: Diagnosis not present

## 2017-11-07 LAB — BASIC METABOLIC PANEL
ANION GAP: 8 (ref 5–15)
BUN: 52 mg/dL — ABNORMAL HIGH (ref 6–20)
CO2: 33 mmol/L — AB (ref 22–32)
Calcium: 7.8 mg/dL — ABNORMAL LOW (ref 8.9–10.3)
Chloride: 105 mmol/L (ref 101–111)
Creatinine, Ser: 0.95 mg/dL (ref 0.44–1.00)
GFR calc Af Amer: >60 mL/min (ref >60)
GFR calc non Af Amer: 56 mL/min — ABNORMAL LOW (ref >60)
Glucose, Bld: 163 mg/dL — ABNORMAL HIGH (ref 65–99)
POTASSIUM: 3.9 mmol/L (ref 3.5–5.1)
Sodium: 146 mmol/L — ABNORMAL HIGH (ref 135–145)

## 2017-11-07 LAB — CBC
HEMATOCRIT: 28 % — AB (ref 36.0–46.0)
Hemoglobin: 8.3 g/dL — ABNORMAL LOW (ref 12.0–15.0)
MCH: 26.9 pg (ref 26.0–34.0)
MCHC: 29.6 g/dL — ABNORMAL LOW (ref 30.0–36.0)
MCV: 90.9 fL (ref 78.0–100.0)
Platelets: 230 10*3/uL (ref 150–400)
RBC: 3.08 MIL/uL — AB (ref 3.87–5.11)
RDW: 25.2 % — AB (ref 11.5–15.5)
WBC: 9.9 10*3/uL (ref 4.0–10.5)

## 2017-11-07 NOTE — Progress Notes (Signed)
Pulmonary Critical Care Medicine [facility] [place]   PULMONARY SERVICE  PROGRESS NOTE  Date of Service:  November 07, 2017  Jean Chang  ZOX:[id number]  DOB: 1941/04/10   DOA: 11/02/2017  Referring Physician: Carron Curie, MD  HPI: Jean Chang is a 77 y.o. female seen for follow up of Acute on Chronic Respiratory Failure.  At this time patient is on full vent support she has been requiring high FiO2 currently is on 80% peep was increased 8 however did not really seem to make much difference so I suggested we increase the peep further today.  Medications: Reviewed on Rounds  Physical Exam:  Vitals:   Temperature 98.0 degrees pulse 110 respiratory 35 blood pressure 176/80 saturations 95%  Ventilator Settings  Mode of ventilation assist-control FiO2 80% peep 8 tidal volume 497  . General: Comfortable at this time . Eyes: Grossly normal lids, irises & conjunctiva . ENT: grossly tongue is normal . Neck: no obvious mass . Cardiovascular:  S1-S2 normal tachycardia . Respiratory:  No rhonchi expansion is equal . Abdomen:  Soft distended nontender . Skin: no rash seen on limited exam . Musculoskeletal: not rigid . Psychiatric:unable to assess . Neurologic: no seizure no involuntary movements         Labs on Admission:  Basic Metabolic Panel: Recent Labs  Lab 11/02/17 0813 11/04/17 0735 11/05/17 0515 11/06/17 0522 11/07/17 0644  NA 144 141 143 145 146*  K 4.1 3.4* 3.8 3.3* 3.9  CL 106 104 104 105 105  CO2 25 27 28 29  33*  GLUCOSE 149* 196* 104* 50* 163*  BUN 54* 48* 46* 44* 52*  CREATININE 1.08* 1.06* 0.91 0.84 0.95  CALCIUM 8.1* 7.9* 8.0* 8.0* 7.8*  MG  --  2.0 2.1  --   --     Liver Function Tests: No results for input(s): AST, ALT, ALKPHOS, BILITOT, PROT, ALBUMIN in the last 168 hours. No results for input(s): LIPASE, AMYLASE in the last 168 hours. No results for input(s): AMMONIA in the last 168 hours.  CBC: Recent Labs  Lab 11/01/17 0733  11/01/17 1514 11/02/17 0813 11/04/17 0735 11/05/17 0515 11/07/17 0644  WBC 14.9*  --  17.1* 13.4* 12.5* 9.9  HGB 7.4* 7.5* 8.0* 6.6* 9.9* 8.3*  HCT 23.5* 24.6* 26.5* 21.5* 31.8* 28.0*  MCV 94.4  --  96.0 97.3 86.4 90.9  PLT 335  --  380 355 294 230    Cardiac Enzymes: No results for input(s): CKTOTAL, CKMB, CKMBINDEX, TROPONINI in the last 168 hours.  BNP (last 3 results) Recent Labs    10/28/17 0652  BNP 1,463.1*    ProBNP (last 3 results) No results for input(s): PROBNP in the last 8760 hours.  Radiological Exams on Admission: Dg Chest Port 1 View  Result Date: 11/05/2017 CLINICAL DATA:  Respiratory failure EXAM: PORTABLE CHEST 1 VIEW COMPARISON:  11/03/2017 FINDINGS: Endotracheal tube with the tip 5.2 cm above the carina. Nasogastric tube coursing below the diaphragm. Left-sided PICC line projecting over the axilla. Diffuse bilateral interstitial and alveolar airspace opacities. No pleural effusion or pneumothorax. Stable cardiomediastinal silhouette. No acute osseous abnormality. IMPRESSION: 1. Endotracheal tube in satisfactory position. 2. Left-sided PICC line with the tip projecting over the axilla. 3. Bilateral interstitial and alveolar airspace opacities which may reflect pulmonary edema versus multilobar pneumonia versus ARDS. Electronically Signed   By: Elige Ko   On: 11/05/2017 16:06   Dg Abd Portable 1v  Result Date: 11/07/2017 CLINICAL DATA:  Ileus EXAM:  PORTABLE ABDOMEN - 1 VIEW COMPARISON:  November 02, 2017 FINDINGS: Nasogastric tube tip and side port are in the distal stomach. There is a paucity of small bowel gas. There is no bowel dilatation or air-fluid level to suggest bowel obstruction. No free air. Postoperative changes noted in the proximal left femur. There are phleboliths in the pelvis. There is edema and consolidation in the lung bases. IMPRESSION: Nasogastric tube tip and side port in distal stomach. Paucity of gas which may be seen normally but also may  be seen as a consequence of early ileus or enteritis. Bowel obstruction not felt likely. No free air. Edema and consolidation in lung bases. Electronically Signed   By: Bretta BangWilliam  Woodruff III M.D.   On: 11/07/2017 13:33    Assessment/Plan Principal Problem:   Acute on chronic respiratory failure with hypoxia (HCC) Active Problems:   Sepsis (HCC)   Chronic kidney disease, stage 3 (HCC)   Healthcare-associated pneumonia   Pulmonary edema   ARDS (adult respiratory distress syndrome) (HCC)   1.  acute on chronic Respiratory failure with hypoxia patient right now is on full vent support is not been tolerating any weaning attempts will continue with assist-control mode I have asked for the peep to be increased up to a maximum of 12 we will continue to titrate as tolerated.   2. Chronic kidney disease stage 3 monitor labs creatinine has been improving 3. Sepsis treated with antibiotics we will continue to follow not doing very well at this time 4. ARDS versus pulmonary edema clinically getting worse with increased oxygen requirements   I have personally seen and evaluated the patient, evaluated laboratory and imaging results, formulated the assessment and plan and placed orders. The Patient requires high complexity decision making for assessment and support.  Case was discussed on Rounds with the Respiratory Therapy Staff  Yevonne PaxSaadat A Advika Mclelland, MD [facility] Pulmonary Critical Care Medicine Sleep Medicine

## 2017-11-07 DEATH — deceased

## 2017-11-08 ENCOUNTER — Other Ambulatory Visit (HOSPITAL_COMMUNITY): Payer: Self-pay

## 2017-11-08 ENCOUNTER — Encounter: Admission: AD | Disposition: E | Payer: Self-pay | Source: Ambulatory Visit | Attending: Internal Medicine

## 2017-11-08 ENCOUNTER — Encounter (HOSPITAL_COMMUNITY): Payer: Self-pay | Admitting: Certified Registered Nurse Anesthetist

## 2017-11-08 DIAGNOSIS — J80 Acute respiratory distress syndrome: Secondary | ICD-10-CM | POA: Diagnosis not present

## 2017-11-08 DIAGNOSIS — J9621 Acute and chronic respiratory failure with hypoxia: Secondary | ICD-10-CM | POA: Diagnosis not present

## 2017-11-08 DIAGNOSIS — J189 Pneumonia, unspecified organism: Secondary | ICD-10-CM | POA: Diagnosis not present

## 2017-11-08 DIAGNOSIS — N183 Chronic kidney disease, stage 3 (moderate): Secondary | ICD-10-CM | POA: Diagnosis not present

## 2017-11-08 HISTORY — PX: TRACHEOSTOMY TUBE PLACEMENT: SHX814

## 2017-11-08 LAB — CBC
HCT: 29.9 % — ABNORMAL LOW (ref 36.0–46.0)
HEMATOCRIT: 28.7 % — AB (ref 36.0–46.0)
HEMOGLOBIN: 8.3 g/dL — AB (ref 12.0–15.0)
HEMOGLOBIN: 8.7 g/dL — AB (ref 12.0–15.0)
MCH: 26.5 pg (ref 26.0–34.0)
MCH: 26.9 pg (ref 26.0–34.0)
MCHC: 28.9 g/dL — AB (ref 30.0–36.0)
MCHC: 29.1 g/dL — ABNORMAL LOW (ref 30.0–36.0)
MCV: 91.7 fL (ref 78.0–100.0)
MCV: 92.6 fL (ref 78.0–100.0)
Platelets: 161 10*3/uL (ref 150–400)
Platelets: 139 10*3/uL — ABNORMAL LOW (ref 150–400)
RBC: 3.13 MIL/uL — AB (ref 3.87–5.11)
RBC: 3.23 MIL/uL — AB (ref 3.87–5.11)
RDW: 24.7 % — ABNORMAL HIGH (ref 11.5–15.5)
RDW: 24.3 % — ABNORMAL HIGH (ref 11.5–15.5)
WBC: 9.8 10*3/uL (ref 4.0–10.5)
WBC: 12 10*3/uL — ABNORMAL HIGH (ref 4.0–10.5)

## 2017-11-08 LAB — RENAL FUNCTION PANEL
ALBUMIN: 1.9 g/dL — AB (ref 3.5–5.0)
ALBUMIN: 1.9 g/dL — AB (ref 3.5–5.0)
Anion gap: 12 (ref 5–15)
Anion gap: 11 (ref 5–15)
BUN: 67 mg/dL — ABNORMAL HIGH (ref 6–20)
BUN: 67 mg/dL — ABNORMAL HIGH (ref 6–20)
CALCIUM: 7.7 mg/dL — AB (ref 8.9–10.3)
CHLORIDE: 108 mmol/L (ref 101–111)
CO2: 30 mmol/L (ref 22–32)
CO2: 27 mmol/L (ref 22–32)
CREATININE: 1.32 mg/dL — AB (ref 0.44–1.00)
Calcium: 7.7 mg/dL — ABNORMAL LOW (ref 8.9–10.3)
Chloride: 103 mmol/L (ref 101–111)
Creatinine, Ser: 1.34 mg/dL — ABNORMAL HIGH (ref 0.44–1.00)
GFR, EST AFRICAN AMERICAN: 43 mL/min — AB (ref >60)
GFR, EST AFRICAN AMERICAN: 44 mL/min — AB (ref >60)
GFR, EST NON AFRICAN AMERICAN: 37 mL/min — AB (ref >60)
GFR, EST NON AFRICAN AMERICAN: 38 mL/min — AB (ref >60)
GLUCOSE: 168 mg/dL — AB (ref 65–99)
Glucose, Bld: 202 mg/dL — ABNORMAL HIGH (ref 65–99)
PHOSPHORUS: 4.2 mg/dL (ref 2.5–4.6)
POTASSIUM: 3.7 mmol/L (ref 3.5–5.1)
POTASSIUM: 4.2 mmol/L (ref 3.5–5.1)
Phosphorus: 4 mg/dL (ref 2.5–4.6)
SODIUM: 145 mmol/L (ref 135–145)
Sodium: 146 mmol/L — ABNORMAL HIGH (ref 135–145)

## 2017-11-08 LAB — MAGNESIUM
MAGNESIUM: 2.1 mg/dL (ref 1.7–2.4)
MAGNESIUM: 2 mg/dL (ref 1.7–2.4)

## 2017-11-08 SURGERY — CREATION, TRACHEOSTOMY
Anesthesia: General | Site: Neck

## 2017-11-08 MED ORDER — MIDAZOLAM HCL 2 MG/2ML IJ SOLN
INTRAMUSCULAR | Status: DC | PRN
  Administered 2017-11-08: 0.5 mg via INTRAVENOUS
  Administered 2017-11-08: 1.5 mg via INTRAVENOUS

## 2017-11-08 MED ORDER — SUGAMMADEX SODIUM 200 MG/2ML IV SOLN
INTRAVENOUS | Status: AC
  Filled 2017-11-08: qty 2

## 2017-11-08 MED ORDER — 0.9 % SODIUM CHLORIDE (POUR BTL) OPTIME
TOPICAL | Status: DC | PRN
  Administered 2017-11-08: 1000 mL

## 2017-11-08 MED ORDER — LIDOCAINE-EPINEPHRINE 1 %-1:100000 IJ SOLN
INTRAMUSCULAR | Status: DC | PRN
  Administered 2017-11-08: 5 mL

## 2017-11-08 MED ORDER — MIDAZOLAM HCL 2 MG/2ML IJ SOLN
INTRAMUSCULAR | Status: AC
Start: 2017-11-08 — End: ?
  Filled 2017-11-08: qty 2

## 2017-11-08 MED ORDER — EPHEDRINE 5 MG/ML INJ
INTRAVENOUS | Status: AC
  Filled 2017-11-08: qty 10

## 2017-11-08 MED ORDER — SODIUM CHLORIDE 0.9 % IV SOLN
INTRAVENOUS | Status: DC | PRN
  Administered 2017-11-08: 09:00:00 via INTRAVENOUS

## 2017-11-08 MED ORDER — ONDANSETRON HCL 4 MG/2ML IJ SOLN
INTRAMUSCULAR | Status: AC
Start: 2017-11-08 — End: ?
  Filled 2017-11-08: qty 2

## 2017-11-08 MED ORDER — FENTANYL CITRATE (PF) 250 MCG/5ML IJ SOLN
INTRAMUSCULAR | Status: AC
  Filled 2017-11-08: qty 5

## 2017-11-08 MED ORDER — STERILE WATER FOR IRRIGATION IR SOLN
Status: DC | PRN
  Administered 2017-11-08: 1000 mL

## 2017-11-08 MED ORDER — ROCURONIUM BROMIDE 10 MG/ML (PF) SYRINGE
PREFILLED_SYRINGE | INTRAVENOUS | Status: AC
  Filled 2017-11-08: qty 5

## 2017-11-08 MED ORDER — PROPOFOL 10 MG/ML IV BOLUS
INTRAVENOUS | Status: AC
  Filled 2017-11-08: qty 20

## 2017-11-08 MED ORDER — DEXAMETHASONE SODIUM PHOSPHATE 10 MG/ML IJ SOLN
INTRAMUSCULAR | Status: AC
  Filled 2017-11-08: qty 1

## 2017-11-08 MED ORDER — PHENYLEPHRINE 40 MCG/ML (10ML) SYRINGE FOR IV PUSH (FOR BLOOD PRESSURE SUPPORT)
PREFILLED_SYRINGE | INTRAVENOUS | Status: DC | PRN
  Administered 2017-11-08: 40 ug via INTRAVENOUS

## 2017-11-08 MED ORDER — SUCCINYLCHOLINE CHLORIDE 200 MG/10ML IV SOSY
PREFILLED_SYRINGE | INTRAVENOUS | Status: AC
  Filled 2017-11-08: qty 10

## 2017-11-08 MED ORDER — PHENYLEPHRINE 40 MCG/ML (10ML) SYRINGE FOR IV PUSH (FOR BLOOD PRESSURE SUPPORT)
PREFILLED_SYRINGE | INTRAVENOUS | Status: AC
  Filled 2017-11-08: qty 10

## 2017-11-08 MED ORDER — ROCURONIUM BROMIDE 10 MG/ML (PF) SYRINGE
PREFILLED_SYRINGE | INTRAVENOUS | Status: DC | PRN
  Administered 2017-11-08: 20 mg via INTRAVENOUS
  Administered 2017-11-08: 30 mg via INTRAVENOUS

## 2017-11-08 SURGICAL SUPPLY — 42 items
ATTRACTOMAT 16X20 MAGNETIC DRP (DRAPES) IMPLANT
BLADE SURG 15 STRL LF DISP TIS (BLADE) ×1 IMPLANT
BLADE SURG 15 STRL SS (BLADE) ×2
CLEANER TIP ELECTROSURG 2X2 (MISCELLANEOUS) ×3 IMPLANT
COVER SURGICAL LIGHT HANDLE (MISCELLANEOUS) IMPLANT
DRAPE HALF SHEET 40X57 (DRAPES) IMPLANT
ELECT COATED BLADE 2.86 ST (ELECTRODE) ×3 IMPLANT
ELECT REM PT RETURN 9FT ADLT (ELECTROSURGICAL) ×3
ELECTRODE REM PT RTRN 9FT ADLT (ELECTROSURGICAL) ×1 IMPLANT
GAUZE SPONGE 4X4 16PLY XRAY LF (GAUZE/BANDAGES/DRESSINGS) ×3 IMPLANT
GEL ULTRASOUND 20GR AQUASONIC (MISCELLANEOUS) ×3 IMPLANT
GLOVE BIOGEL PI IND STRL 7.0 (GLOVE) ×3 IMPLANT
GLOVE BIOGEL PI IND STRL 7.5 (GLOVE) ×2 IMPLANT
GLOVE BIOGEL PI INDICATOR 7.0 (GLOVE) ×6
GLOVE BIOGEL PI INDICATOR 7.5 (GLOVE) ×4
GLOVE SS BIOGEL STRL SZ 7.5 (GLOVE) ×1 IMPLANT
GLOVE SUPERSENSE BIOGEL SZ 7.5 (GLOVE) ×2
GOWN STRL REUS W/ TWL LRG LVL3 (GOWN DISPOSABLE) ×1 IMPLANT
GOWN STRL REUS W/ TWL XL LVL3 (GOWN DISPOSABLE) ×1 IMPLANT
GOWN STRL REUS W/TWL LRG LVL3 (GOWN DISPOSABLE) ×2
GOWN STRL REUS W/TWL XL LVL3 (GOWN DISPOSABLE) ×2
HOLDER TRACH TUBE VELCRO 19.5 (MISCELLANEOUS) ×6 IMPLANT
KIT BASIN OR (CUSTOM PROCEDURE TRAY) ×3 IMPLANT
KIT SUCTION CATH 14FR (SUCTIONS) ×3 IMPLANT
KIT TURNOVER KIT B (KITS) ×3 IMPLANT
NEEDLE HYPO 25GX1X1/2 BEV (NEEDLE) ×3 IMPLANT
NS IRRIG 1000ML POUR BTL (IV SOLUTION) ×3 IMPLANT
PACK EENT II TURBAN DRAPE (CUSTOM PROCEDURE TRAY) ×3 IMPLANT
PAD ARMBOARD 7.5X6 YLW CONV (MISCELLANEOUS) IMPLANT
PENCIL BUTTON HOLSTER BLD 10FT (ELECTRODE) ×3 IMPLANT
SPONGE DRAIN TRACH 4X4 STRL 2S (GAUZE/BANDAGES/DRESSINGS) ×3 IMPLANT
SPONGE INTESTINAL PEANUT (DISPOSABLE) ×3 IMPLANT
SUT SILK 2 0 SH CR/8 (SUTURE) ×3 IMPLANT
SUT SILK 3 0 TIES 10X30 (SUTURE) IMPLANT
SYR 5ML LUER SLIP (SYRINGE) ×3 IMPLANT
SYR CONTROL 10ML LL (SYRINGE) ×3 IMPLANT
TOWEL OR 17X24 6PK STRL BLUE (TOWEL DISPOSABLE) IMPLANT
TOWEL OR 17X26 10 PK STRL BLUE (TOWEL DISPOSABLE) ×3 IMPLANT
TUBE CONNECTING 12'X1/4 (SUCTIONS) ×1
TUBE CONNECTING 12X1/4 (SUCTIONS) ×2 IMPLANT
TUBE TRACH SHILEY  6 DIST  CUF (TUBING) ×3 IMPLANT
TUBE TRACH SHILEY 8 DIST CUF (TUBING) IMPLANT

## 2017-11-08 NOTE — Anesthesia Procedure Notes (Signed)
Date/Time: 11/26/2017 9:36 AM Performed by: Izola Priceockfield, Jarrick Fjeld Walton Jr., CRNA Comments: Pt previously intubated from unit. ett exchanged for trach

## 2017-11-08 NOTE — Anesthesia Postprocedure Evaluation (Signed)
Anesthesia Post Note  Patient: Jean Chang  Procedure(s) Performed: TRACHEOSTOMY (N/A Neck)     Patient location during evaluation: PACU Anesthesia Type: General Level of consciousness: awake and alert Pain management: pain level controlled Vital Signs Assessment: post-procedure vital signs reviewed and stable Respiratory status: spontaneous breathing, nonlabored ventilation, respiratory function stable and patient connected to nasal cannula oxygen Cardiovascular status: blood pressure returned to baseline and stable Postop Assessment: no apparent nausea or vomiting Anesthetic complications: no    Last Vitals: There were no vitals filed for this visit.  Last Pain: There were no vitals filed for this visit.               Esme Durkin

## 2017-11-08 NOTE — Anesthesia Preprocedure Evaluation (Addendum)
Anesthesia Evaluation  Patient identified by MRN, date of birth, ID band Patient awake    Reviewed: Allergy & Precautions, NPO status , Patient's Chart, lab work & pertinent test results  Airway Mallampati: II  TM Distance: >3 FB Neck ROM: Full    Dental no notable dental hx.    Pulmonary    Pulmonary exam normal breath sounds clear to auscultation       Cardiovascular hypertension, Normal cardiovascular exam Rhythm:Regular Rate:Normal     Neuro/Psych negative neurological ROS  negative psych ROS   GI/Hepatic negative GI ROS, Neg liver ROS,   Endo/Other  diabetes  Renal/GU negative Renal ROS  negative genitourinary   Musculoskeletal negative musculoskeletal ROS (+)   Abdominal   Peds negative pediatric ROS (+)  Hematology negative hematology ROS (+)   Anesthesia Other Findings   Reproductive/Obstetrics negative OB ROS                             Anesthesia Physical Anesthesia Plan  ASA: IV and emergent  Anesthesia Plan: General   Post-op Pain Management:    Induction: Intravenous  PONV Risk Score and Plan: 3 and Treatment may vary due to age or medical condition  Airway Management Planned: Oral ETT and Tracheostomy  Additional Equipment:   Intra-op Plan:   Post-operative Plan: Post-operative intubation/ventilation  Informed Consent: I have reviewed the patients History and Physical, chart, labs and discussed the procedure including the risks, benefits and alternatives for the proposed anesthesia with the patient or authorized representative who has indicated his/her understanding and acceptance.     Plan Discussed with: CRNA, Anesthesiologist and Surgeon  Anesthesia Plan Comments: (  )       Anesthesia Quick Evaluation

## 2017-11-08 NOTE — Op Note (Signed)
NAME:  Jean Chang, Jean Chang                   ACCOUNT NO.:  MEDICAL RECORD NO.:  [id number]  LOCATION:                                 FACILITY:  PHYSICIAN:  Kristine GarbeChristopher E. Ezzard StandingNewman, M.D. DATE OF BIRTH:  DATE OF PROCEDURE:  11/28/2017 DATE OF DISCHARGE:                              OPERATIVE REPORT   PREOPERATIVE DIAGNOSIS:  Acute on chronic respiratory failure.  POSTOPERATIVE DIAGNOSIS:  Acute on chronic respiratory failure.  OPERATION:  Tracheostomy with a #6 Shiley cuffed tube.  SURGEON:  Kristine GarbeChristopher E. Ezzard StandingNewman, M.D.  ANESTHESIA:  General endotracheal.  COMPLICATIONS:  None.  BLOOD LOSS:  Minimal.  BRIEF CLINICAL NOTE:  Norris CrossBetty Wulff is a 77 year old female who recently sustained a fall with multiple rib fractures and subsequent respiratory failure, pneumonia, and sepsis.  She was transferred from an outside hospital [facility] a little over 10 days ago. She has been intubated since that time with poor weaning, and tracheostomy was recommended.  She was taken to the operating room at this time for a tracheotomy.  DESCRIPTION OF PROCEDURE:  The patient was brought straight [facility] to the operating room.  She remained in her bed.  A roll was placed underneath the shoulders to extend her neck slightly.  The neck was then prepped with Betadine solution.  The proposed incision site was marked out and injected with 5 mL of xylocaine with epinephrine for hemostasis and local anesthetic.  A vertical incision was made just below the level of cricoid cartilage in midline in the neck.  Subcutaneous tissue was cauterized for hemostasis. The strap muscles were divided in midline and retracted laterally.  The thyroid isthmus overlies the first couple tracheal rings and was divided with cautery.  A horizontal tracheotomy was performed between the second and third tracheal rings.  The endotracheal tube was removed, and a #6 cuffed Shiley tube  was inserted without any difficulty.  The patient was ventilated well.  This was secured to the neck with 2-0 silk sutures x4 and Velcro trach collar around the neck.  After placement of the trach, a nasogastric tube was placed by CRNA.  The [facility].          ______________________________ Kristine Garbehristopher E. Ezzard StandingNewman, M.D.     CEN/MEDQ  D:  11/20/2017  T:  11/20/2017  Job:  [id number]

## 2017-11-08 NOTE — H&P (Signed)
PREOPERATIVE H&P  Chief Complaint: Respiratory failure  HPI: Tarry KosBetty L Couts is a 77 y.o. female who presents for evaluation of acute on chronic respiratory failure.  Patient with history of multiple rib fractures and subsequent pneumonia as well as sepsis.  This required intubation on ventilation support and ICU care.  [facility]ated and on vent.  She is unable to be weaned off the vent.  Tracheostomy was recommended.  Patient was taken to the operating room for tracheostomy.  Past Medical History:  Diagnosis Date  . Acute on chronic respiratory failure with hypoxia (HCC)   . ARDS (adult respiratory distress syndrome) (HCC) 11/06/2017  . Chronic kidney disease, stage 3 (HCC)   . Diabetes mellitus (HCC)   . Healthcare-associated pneumonia   . Hip fracture (HCC)   . Hypertension   . Pneumonia   . Rib fracture   . Sepsis [facility](HCC)    Past Surgical History:  Procedure Laterality Date  .  eye injection    . HIP FRACTURE SURGERY    . RIB FRACTURE SURGERY     Social History   Socioeconomic History  . Marital status: Married    Spouse name: Not on file  . Number of children: Not on file  . Years of education: Not on file  . Highest education level: Not on file  Occupational History  . Not on file  Social Needs  . Financial resource strain: Not on file  . Food insecurity:    Worry: Not on file    Inability: Not on file  . Transportation needs:    Medical: Not on file    Non-medical: Not on file  Tobacco Use  . Smoking status: Never Smoker  . Smokeless tobacco: Never Used  Substance and Sexual Activity  . Alcohol use: Not Currently  . Drug use: Not Currently  . Sexual activity: Not Currently  Lifestyle  . Physical activity:    Days per week: Not on file    Minutes per session: Not on file  . Stress: Not on file  Relationships  . Social connections:    Talks on phone: Not on file    Gets together: Not on  file    Attends religious service: Not on file    Active member of club or organization: Not on file    Attends meetings of clubs or organizations: Not on file    Relationship status: Not on file  Other Topics Concern  . Not on file  Social History Narrative  . Not on file   Family History  Family history unknown: Yes   Allergies not on file Prior to Admission medications   Not on File     Positive ROS: Negative  All other systems have been reviewed and were otherwise negative with the exception of those mentioned in the HPI and as above.  Physical Exam: There were no vitals filed for this visit.  General: Intubated and poorly responsive.  Discussed trach with family members Nasal: Clear nasal passages Neck: No palpable adenopathy or thyroid nodules.  Trachea midline without masses. Cardiovascular: Regular rate and rhythm, no murmur.  Respiratory: Clear to auscultation  Assessment/Plan: RESPRITORY FAILURE Plan for Procedure(s): TRACHEOSTOMY   Dillard Cannonhristopher Lori Liew, MD 11/21/2017 9:03 AM

## 2017-11-08 NOTE — Brief Op Note (Signed)
12/01/2017  9:49 AM  PATIENT:  Jean Chang  77 y.o. female  PRE-OPERATIVE DIAGNOSIS:  RESPRITORY FAILURE  POST-OPERATIVE DIAGNOSIS:  Respiratory Failure  PROCEDURE:  Procedure(s) with comments: TRACHEOSTOMY (N/A) - Anterior neck/Trachea  # 6 Shiley cuffed  SURGEON:  Surgeon(s) and Role:    Drema Halon* Newman, Christopher E, MD - Primary  PHYSICIAN ASSISTANT:   ASSISTANTS: none   ANESTHESIA:   general  EBL:  minimal   BLOOD ADMINISTERED:none  DRAINS: none   LOCAL MEDICATIONS USED:  XYLOCAINE with epi 5 cc  SPECIMEN:  No Specimen  DISPOSITION OF SPECIMEN:  N/A  COUNTS:  YES  TOURNIQUET:  * No tourniquets in log *  DICTATION: .Other Dictation: Dictation Number [id number]  PLAN OF CARE: Discharge to home after PACU  PATIENT DISPOSITION:  PACU - hemodynamically stable.   Delay start of Pharmacological VTE agent (>24hrs) due to surgical blood loss or risk of bleeding: yes

## 2017-11-08 NOTE — Transfer of Care (Signed)
Immediate Anesthesia Transfer of Care Note  Patient: Jean Chang  Procedure(s) Performed: TRACHEOSTOMY (N/A Neck)  [facility]  Anesthesia Type:General  Level of Consciousness: Patient remains intubated per anesthesia plan  Airway & Oxygen Therapy: Patient remains intubated per anesthesia plan and Patient placed on Ventilator (see vital sign flow sheet for setting)  Post-op Assessment: Report given to RN and Post -op Vital signs reviewed and stable  Post vital signs: Reviewed and stable  Last Vitals:  Vitals Value Taken Time  BP    Temp    Pulse    Resp    SpO2      Last Pain: There were no vitals filed for this visit.       Complications: No apparent anesthesia complications

## 2017-11-08 NOTE — Progress Notes (Addendum)
Pulmonary Critical Care Medicine [facility]   PULMONARY SERVICE  PROGRESS NOTE  Date of Service: November 08, 2017  Jean KosBetty L Jensen  ZOX:[id number]  DOB: [birth year]   DOA: 07/17/18  Referring Physician: Carron CurieAli Hijazi, MD  HPI: Jean Chang is a 77 y.o. female seen for follow up of Acute on Chronic Respiratory Failure.  Patient remains on the ventilator she was on full support assist-control.  We were able to decrease her oxygen down to 60% peep is still at 10.  She was scheduled to have tracheostomy done which was done today by Dr. Ezzard StandingNewman  Medications: Reviewed on Rounds  Physical Exam:  Vitals:  Temperature 98.6 degrees pulse 78 respiratory 10 blood pressure 138/64 saturations 100%  Ventilator Settings  Mode of ventilation assist-control FiO2 60% tidal volume 547 peep 10  . General: Comfortable at this time . Eyes: Grossly normal lids, irises & conjunctiva . ENT: grossly tongue is normal . Neck: no obvious mass . Cardiovascular:  S1-S2 normal no gallop . Respiratory:  Coarse breath sounds no rhonchi . Abdomen:  Distended and soft . Skin: no rash seen on limited exam . Musculoskeletal: not rigid . Psychiatric:unable to assess . Neurologic: no seizure no involuntary movements         Labs on Admission:  Basic Metabolic Panel: Recent Labs  Lab 11/04/17 0735 11/05/17 0515 11/06/17 0522 11/07/17 0644 12/06/2017 0645 11/17/2017 1317  NA 141 143 145 146* 145 146*  K 3.4* 3.8 3.3* 3.9 3.7 4.2  CL 104 104 105 105 103 108  CO2 27 28 29  33* 30 27  GLUCOSE 196* 104* 50* 163* 168* 202*  BUN 48* 46* 44* 52* 67* 67*  CREATININE 1.06* 0.91 0.84 0.95 1.34* 1.32*  CALCIUM 7.9* 8.0* 8.0* 7.8* 7.7* 7.7*  MG 2.0 2.1  --   --  2.1 2.0  PHOS  --   --   --   --  4.0 4.2    Liver Function Tests: Recent Labs  Lab 11/13/2017 0645 11/28/2017 1317  ALBUMIN 1.9* 1.9*   No results for input(s): LIPASE, AMYLASE in the last 168 hours. No results for input(s): AMMONIA in  the last 168 hours.  CBC: Recent Labs  Lab 11/04/17 0735 11/05/17 0515 11/07/17 0644 11/18/2017 0645 11/15/2017 1317  WBC 13.4* 12.5* 9.9 9.8 12.0*  HGB 6.6* 9.9* 8.3* 8.3* 8.7*  HCT 21.5* 31.8* 28.0* 28.7* 29.9*  MCV 97.3 86.4 90.9 91.7 92.6  PLT 355 294 230 161 139*    Cardiac Enzymes: No results for input(s): CKTOTAL, CKMB, CKMBINDEX, TROPONINI in the last 168 hours.  BNP (last 3 results) Recent Labs    10/28/17 0652  BNP 1,463.1*    ProBNP (last 3 results) No results for input(s): PROBNP in the last 8760 hours.  Radiological Exams on Admission: Dg Chest Port 1 View  Result Date: 11/05/2017 CLINICAL DATA:  Respiratory failure EXAM: PORTABLE CHEST 1 VIEW COMPARISON:  11/03/2017 FINDINGS: Endotracheal tube with the tip 5.2 cm above the carina. Nasogastric tube coursing below the diaphragm. Left-sided PICC line projecting over the axilla. Diffuse bilateral interstitial and alveolar airspace opacities. No pleural effusion or pneumothorax. Stable cardiomediastinal silhouette. No acute osseous abnormality. IMPRESSION: 1. Endotracheal tube in satisfactory position. 2. Left-sided PICC line with the tip projecting over the axilla. 3. Bilateral interstitial and alveolar airspace opacities which may reflect pulmonary edema versus multilobar pneumonia versus ARDS. Electronically Signed   By: Elige KoHetal  Patel   On: 11/05/2017 16:06   Dg Abd  Portable 1v  Result Date: 11/10/2017 CLINICAL DATA:  NG placement EXAM: PORTABLE ABDOMEN - 1 VIEW COMPARISON:  11/07/2017 FINDINGS: NG tip in the body the stomach. Stomach appears dilated based on the catheter position. No other dilated bowel loops. Severe bilateral airspace disease. IMPRESSION: NG tip in the stomach which appears dilated based on catheter position Severe bilateral airspace disease. Electronically Signed   By: Marlan Palau M.D.   On: 11/25/2017 12:26   Dg Abd Portable 1v  Result Date: 11/07/2017 CLINICAL DATA:  Ileus EXAM: PORTABLE ABDOMEN  - 1 VIEW COMPARISON:  November 02, 2017 FINDINGS: Nasogastric tube tip and side port are in the distal stomach. There is a paucity of small bowel gas. There is no bowel dilatation or air-fluid level to suggest bowel obstruction. No free air. Postoperative changes noted in the proximal left femur. There are phleboliths in the pelvis. There is edema and consolidation in the lung bases. IMPRESSION: Nasogastric tube tip and side port in distal stomach. Paucity of gas which may be seen normally but also may be seen as a consequence of early ileus or enteritis. Bowel obstruction not felt likely. No free air. Edema and consolidation in lung bases. Electronically Signed   By: Bretta Bang III M.D.   On: 11/07/2017 13:33    Assessment/Plan Principal Problem:   Acute on chronic respiratory failure with hypoxia (HCC) Active Problems:   Sepsis (HCC)   Chronic kidney disease, stage 3 (HCC)   Healthcare-associated pneumonia   Pulmonary edema   ARDS (adult respiratory distress syndrome) (HCC)   1.  acute on chronic Respiratory failure with hypoxia patient will continue on full vent support slowly wean oxygen as tolerated schedule for tracheostomy today which will be done will reassess tomorrow the RSBI and try to start weaning again 2. Chronic kidney disease stage 3 continue to monitor the labs 3. Healthcare associated pneumonia treated with antibiotics 4. ARDS chest x-ray still shows significant infiltrate and oxygen requirements are slowly improving will continue to follow 5. Pulmonary edema diurese as tolerated 6. Sepsis hemodynamically stable we will continue to monitor   I have personally seen and evaluated the patient, evaluated laboratory and imaging results, formulated the assessment and plan and placed orders. The Patient requires high complexity decision making for assessment and support.  Case was discussed on Rounds with the Respiratory Therapy Staff  Yevonne Pax, MD [facility] Pulmonary  Critical Care Medicine Sleep Medicine

## 2017-11-09 ENCOUNTER — Encounter: Payer: Self-pay | Admitting: *Deleted

## 2017-11-09 ENCOUNTER — Other Ambulatory Visit (HOSPITAL_COMMUNITY): Payer: Self-pay

## 2017-11-09 DIAGNOSIS — J80 Acute respiratory distress syndrome: Secondary | ICD-10-CM | POA: Diagnosis not present

## 2017-11-09 DIAGNOSIS — J9621 Acute and chronic respiratory failure with hypoxia: Secondary | ICD-10-CM | POA: Diagnosis not present

## 2017-11-09 DIAGNOSIS — J189 Pneumonia, unspecified organism: Secondary | ICD-10-CM | POA: Diagnosis not present

## 2017-11-09 DIAGNOSIS — N183 Chronic kidney disease, stage 3 (moderate): Secondary | ICD-10-CM | POA: Diagnosis not present

## 2017-11-09 LAB — CBC
HCT: 31 % — ABNORMAL LOW (ref 36.0–46.0)
HEMOGLOBIN: 9.2 g/dL — AB (ref 12.0–15.0)
MCH: 26.7 pg (ref 26.0–34.0)
MCHC: 29.7 g/dL — ABNORMAL LOW (ref 30.0–36.0)
MCV: 90.1 fL (ref 78.0–100.0)
PLATELETS: 101 10*3/uL — AB (ref 150–400)
RBC: 3.44 MIL/uL — AB (ref 3.87–5.11)
RDW: 24.2 % — ABNORMAL HIGH (ref 11.5–15.5)
WBC: 13.8 10*3/uL — AB (ref 4.0–10.5)

## 2017-11-09 LAB — URINALYSIS, ROUTINE W REFLEX MICROSCOPIC
Glucose, UA: NEGATIVE mg/dL
KETONES UR: 15 mg/dL — AB
NITRITE: POSITIVE — AB
PH: 5 (ref 5.0–8.0)
Specific Gravity, Urine: >1.03 — ABNORMAL HIGH (ref 1.005–1.030)

## 2017-11-09 LAB — RENAL FUNCTION PANEL
ALBUMIN: 1.9 g/dL — AB (ref 3.5–5.0)
Anion gap: 11 (ref 5–15)
BUN: 82 mg/dL — ABNORMAL HIGH (ref 6–20)
CALCIUM: 8 mg/dL — AB (ref 8.9–10.3)
CO2: 27 mmol/L (ref 22–32)
CREATININE: 1.6 mg/dL — AB (ref 0.44–1.00)
Chloride: 105 mmol/L (ref 101–111)
GFR, EST AFRICAN AMERICAN: 34 mL/min — AB (ref >60)
GFR, EST NON AFRICAN AMERICAN: 30 mL/min — AB (ref >60)
Glucose, Bld: 282 mg/dL — ABNORMAL HIGH (ref 65–99)
PHOSPHORUS: 4.9 mg/dL — AB (ref 2.5–4.6)
Potassium: 4.6 mmol/L (ref 3.5–5.1)
SODIUM: 143 mmol/L (ref 135–145)

## 2017-11-09 LAB — URINALYSIS, MICROSCOPIC (REFLEX)

## 2017-11-09 LAB — OCCULT BLOOD X 1 CARD TO LAB, STOOL: FECAL OCCULT BLD: NEGATIVE

## 2017-11-09 LAB — MAGNESIUM: Magnesium: 2.1 mg/dL (ref 1.7–2.4)

## 2017-11-09 NOTE — Progress Notes (Signed)
[facility]   PULMONARY SERVICE  PROGRESS NOTE  Date of Service:  November 09, 2017  Jean Chang  [id number]  DOB: 1941/08/01   DOA: 10/23/2017  Referring Physician: Carron CurieAli Hijazi, MD  HPI: Jean Chang is a 77 y.o. female seen for follow up of Acute on Chronic Respiratory Failure.  Patient remains on full vent support at this time she was attempted on wean the RSBI was poor not able to tolerate a pressure support 5/5 placed back on the ventilator at this time is on 55% oxygen  Medications: Reviewed on Rounds  Physical Exam:  Vitals:  Temperature 98.7 degrees pulse 127 respiratory 31 blood pressure 137/46 saturations 94%  Ventilator Settings  Mode of ventilation assist-control FiO2 55% tidal volume 718 peep 8  . General: Comfortable at this time . Eyes: Grossly normal lids, irises & conjunctiva . ENT: grossly tongue is normal . Neck: no obvious mass . Cardiovascular:  S1-S2 normal no gallop or rub . Respiratory:  No rhonchi expansion is equal . Abdomen:  Soft nontender . Skin: no rash seen on limited exam . Musculoskeletal: not rigid . Psychiatric:unable to assess . Neurologic: no seizure no involuntary movements         Labs on Admission:  Basic Metabolic Panel: Recent Labs  Lab 11/04/17 0735 11/05/17 0515 11/06/17 0522 11/07/17 0644 June 08, 2018 0645 June 08, 2018 1317 11/09/17 0618  NA 141 143 145 146* 145 146* 143  K 3.4* 3.8 3.3* 3.9 3.7 4.2 4.6  CL 104 104 105 105 103 108 105  CO2 27 28 29  33* 30 27 27   GLUCOSE 196* 104* 50* 163* 168* 202* 282*  BUN 48* 46* 44* 52* 67* 67* 82*  CREATININE 1.06* 0.91 0.84 0.95 1.34* 1.32* 1.60*  CALCIUM 7.9* 8.0* 8.0* 7.8* 7.7* 7.7* 8.0*  MG 2.0 2.1  --   --  2.1 2.0 2.1  PHOS  --   --   --   --  4.0 4.2 4.9*    Liver Function Tests: Recent Labs  Lab June 08, 2018 0645 June 08, 2018 1317 11/09/17 0618  ALBUMIN 1.9* 1.9* 1.9*   No results for input(s): LIPASE, AMYLASE in  the last 168 hours. No results for input(s): AMMONIA in the last 168 hours.  CBC: Recent Labs  Lab 11/05/17 0515 11/07/17 0644 June 08, 2018 0645 June 08, 2018 1317 11/09/17 0618  WBC 12.5* 9.9 9.8 12.0* 13.8*  HGB 9.9* 8.3* 8.3* 8.7* 9.2*  HCT 31.8* 28.0* 28.7* 29.9* 31.0*  MCV 86.4 90.9 91.7 92.6 90.1  PLT 294 230 161 139* 101*    Cardiac Enzymes: No results for input(s): CKTOTAL, CKMB, CKMBINDEX, TROPONINI in the last 168 hours.  BNP (last 3 results) Recent Labs    10/28/17 0652  BNP 1,463.1*    ProBNP (last 3 results) No results for input(s): PROBNP in the last 8760 hours.  Radiological Exams on Admission: Ct Abdomen Wo Contrast  Result Date: 11/09/2017 CLINICAL DATA:  Evaluate anatomy prior to potential percutaneous gastrostomy tube placement. EXAM: CT ABDOMEN WITHOUT CONTRAST TECHNIQUE: Multidetector CT imaging of the abdomen was performed following the standard protocol without IV contrast. COMPARISON:  Abdominal radiograph-09-03-2017; chest CT-11/09/2017 FINDINGS: Lower chest: Trace bilateral effusions with extensive bibasilar consolidative opacities and associated air bronchograms, right greater than left. Borderline cardiomegaly. Calcifications within the mitral valve annulus. No pericardial effusion. Hepatobiliary: Mild nodularity hepatic contour. Normal noncontrast appearance of gallbladder given underdistention. No ascites. Pancreas: Pancreas appears slightly atrophic. Spleen: The spleen appears atrophic. Adrenals/Urinary Tract: Bilateral  kidneys appear mildly atrophic. Note is made of a slightly exaggerated horizontal lie of the left kidney. No renal stones. There is a minimal amount of grossly symmetric bilateral perinephric stranding. No urinary obstruction. Normal noncontrast appearance of the bilateral adrenal glands given patient motion. The urinary bladder is not imaged. Stomach/Bowel: Enteric tube tip terminates within the mid body of the stomach. There is no interposed  colon between the anterior aspect of the stomach and ventral wall the abdomen, however note is made of mild interposition of the lateral segment of the left lobe of the liver. No evidence of enteric obstruction. Vascular/Lymphatic: Extensive slightly irregular atherosclerotic plaque within a normal caliber abdominal aorta. No bulky retroperitoneal, porta hepatis or mesenteric lymphadenopathy. Other: Diffuse body wall anasarca most conspicuous about the bilateral flanks and lateral abdomen. Musculoskeletal: Mild (approximately 25%) compression deformity involving the superior endplates of the T11 and L5 vertebral bodies without definitive acute fracture lines at either of these locations. Moderate severe DDD of L2-L3 with disc space height loss, endplate irregularity and posteriorly directed osteophytosis. IMPRESSION: 1. Gastric anatomy amenable to potential percutaneous gastrostomy tube placement as indicated, however would recommend sonographic demarcation of the lateral segment of the left lobe of the liver prior to potential percutaneous access. 2. Trace pleural effusions with extensive bibasilar consolidative opacities and associated air bronchograms, right greater than left. Please refer to dedicated chest CT performed earlier same day for additional details and differential considerations. 3. Diffuse body wall anasarca as could be seen in the setting of congestive heart failure. 4. Aortic Atherosclerosis (ICD10-I70.0). Electronically Signed   By: Simonne Come M.D.   On: 11/09/2017 12:40   Ct Head Wo Contrast  Result Date: 11/09/2017 CLINICAL DATA:  Unresponsive EXAM: CT HEAD WITHOUT CONTRAST TECHNIQUE: Contiguous axial images were obtained from the base of the skull through the vertex without intravenous contrast. COMPARISON:  None. FINDINGS: Brain: Mild atrophic changes are noted. Chronic white matter ischemic change is seen. No findings to suggest acute hemorrhage, acute infarction or space-occupying mass  lesion are noted. Vascular: No hyperdense vessel or unexpected calcification. Skull: Normal. Negative for fracture or focal lesion. Sinuses/Orbits: Sinuses are clear. No orbital abnormality is noted. Fluid is noted within the mastoid air cells bilaterally. Other: Soft tissue lesion is noted in the subcutaneous fat in the left parietal region likely representing sebaceous cyst. IMPRESSION: Chronic atrophic and ischemic changes. Fluid in the mastoid air cells of uncertain chronicity. Electronically Signed   By: Alcide Clever M.D.   On: 11/09/2017 11:21   Ct Chest Wo Contrast  Result Date: 11/09/2017 CLINICAL DATA:  Found unresponsive this morning. Admitted for multiple rib fractures and fall. Pneumonia and ventilator dependent. Tracheostomy yesterday. Pulmonary edema. EXAM: CT CHEST WITHOUT CONTRAST TECHNIQUE: Multidetector CT imaging of the chest was performed following the standard protocol without IV contrast. COMPARISON:  Multiple chest radiographs including back to 11/04/2017. No prior CT. FINDINGS: Cardiovascular: Mild degradation secondary to motion and patient arm position, not raised above the head. Advanced aortic and branch vessel atherosclerosis. Moderate cardiomegaly, without pericardial effusion. Multivessel coronary artery atherosclerosis. Pulmonary artery enlargement, outflow tract 3.2 cm. Mediastinum/Nodes: Right supraclavicular node of 10 mm on image 11/3. A right subpectoral node measures 11 mm on image 29/3. Left paratracheal node measures 12 mm on image 55/3. Subcarinal node of 1.4 cm on image 64/3. Hilar regions poorly evaluated without intravenous contrast. Lungs/Pleura: Small bilateral pleural effusions are simple in appearance. Endotracheal tube appropriately positioned. Basilar predominant, but relatively diffuse airspace and less  so ground-glass opacity. No pneumothorax. Upper Abdomen: Deferred to the abdominal CT dictated separately. Musculoskeletal: Anasarca. Multiple left-sided rib  fractures, including an acute or subacute eighth posterolateral left rib fracture. Severe compression deformity at T5 with focal kyphosis and mild ventral canal encroachment. IMPRESSION: 1. Motion and patient position degradation. 2. Diffuse but basilar predominant airspace and ground-glass opacity. Concurrent small bilateral pleural effusions. Favor multifocal pneumonia or aspiration. Concurrent pulmonary edema and/or developing ARDS cannot be excluded. 3. Cardiomegaly. Coronary artery atherosclerosis. Aortic Atherosclerosis (ICD10-I70.0). 4. Pulmonary artery enlargement suggests pulmonary arterial hypertension. 5. Mild thoracic and right supraclavicular adenopathy. This may be reactive. Consider CT follow-up at 3 months to confirm resolution. 6. Severe T5 compression deformity with focal kyphosis and ventral canal encroachment. 7. Please see abdominal CT, dictated separately. Electronically Signed   By: Jeronimo Greaves M.D.   On: 11/09/2017 11:54   Dg Abd Portable 1v  Result Date: 11/07/2017 CLINICAL DATA:  NG placement EXAM: PORTABLE ABDOMEN - 1 VIEW COMPARISON:  11/07/2017 FINDINGS: NG tip in the body the stomach. Stomach appears dilated based on the catheter position. No other dilated bowel loops. Severe bilateral airspace disease. IMPRESSION: NG tip in the stomach which appears dilated based on catheter position Severe bilateral airspace disease. Electronically Signed   By: Marlan Palau M.D.   On: 12/05/2017 12:26   Dg Abd Portable 1v  Result Date: 11/07/2017 CLINICAL DATA:  Ileus EXAM: PORTABLE ABDOMEN - 1 VIEW COMPARISON:  November 02, 2017 FINDINGS: Nasogastric tube tip and side port are in the distal stomach. There is a paucity of small bowel gas. There is no bowel dilatation or air-fluid level to suggest bowel obstruction. No free air. Postoperative changes noted in the proximal left femur. There are phleboliths in the pelvis. There is edema and consolidation in the lung bases. IMPRESSION: Nasogastric  tube tip and side port in distal stomach. Paucity of gas which may be seen normally but also may be seen as a consequence of early ileus or enteritis. Bowel obstruction not felt likely. No free air. Edema and consolidation in lung bases. Electronically Signed   By: Bretta Bang III M.D.   On: 11/07/2017 13:33    Assessment/Plan Principal Problem:   Acute on chronic respiratory failure with hypoxia (HCC) Active Problems:   Sepsis (HCC)   Chronic kidney disease, stage 3 (HCC)   Healthcare-associated pneumonia   Pulmonary edema   ARDS (adult respiratory distress syndrome) (HCC)   1.  acute on chronic Respiratory failure with hypoxia patient failed the RSBI this morning will reassess and try again on pressure support if able tolerate 2. Healthcare associated pneumonia treated with antibiotics we will continue follow  3. Pulmonary edema keep on the dry side  4. ARDS again keep on the dry side monitor x-rays 5. Sepsis clinically treated Will monitor closely   I have personally seen and evaluated the patient, evaluated laboratory and imaging results, formulated the assessment and plan and placed orders. The Patient requires high complexity decision making for assessment and support.  Case was discussed on Rounds with the Respiratory Therapy Staff  Yevonne Pax, MD [facility] Pulmonary Critical Care Medicine Sleep Medicine

## 2017-11-10 ENCOUNTER — Other Ambulatory Visit (HOSPITAL_COMMUNITY): Payer: Self-pay

## 2017-11-10 LAB — BLOOD GAS, ARTERIAL
Acid-Base Excess: 4.8 mmol/L — ABNORMAL HIGH (ref 0.0–2.0)
BICARBONATE: 30.1 mmol/L — AB (ref 20.0–28.0)
DRAWN BY: 23703
FIO2: 80
LHR: 20 {breaths}/min
MECHVT: 450 mL
O2 Saturation: 98.1 %
PATIENT TEMPERATURE: 98.6
PCO2 ART: 56.6 mmHg — AB (ref 32.0–48.0)
PEEP: 10 cmH2O
PO2 ART: 105 mmHg (ref 83.0–108.0)
pH, Arterial: 7.346 — ABNORMAL LOW (ref 7.350–7.450)

## 2017-11-10 LAB — RENAL FUNCTION PANEL
Albumin: 1.7 g/dL — ABNORMAL LOW (ref 3.5–5.0)
Anion gap: 10 (ref 5–15)
BUN: 123 mg/dL — AB (ref 6–20)
CALCIUM: 7.9 mg/dL — AB (ref 8.9–10.3)
CHLORIDE: 104 mmol/L (ref 101–111)
CO2: 28 mmol/L (ref 22–32)
CREATININE: 2.26 mg/dL — AB (ref 0.44–1.00)
GFR calc Af Amer: 23 mL/min — ABNORMAL LOW (ref >60)
GFR calc non Af Amer: 20 mL/min — ABNORMAL LOW (ref >60)
GLUCOSE: 173 mg/dL — AB (ref 65–99)
Phosphorus: 5.7 mg/dL — ABNORMAL HIGH (ref 2.5–4.6)
Potassium: 4.9 mmol/L (ref 3.5–5.1)
SODIUM: 142 mmol/L (ref 135–145)

## 2017-11-10 LAB — CBC
HCT: 24.1 % — ABNORMAL LOW (ref 36.0–46.0)
HEMOGLOBIN: 7.3 g/dL — AB (ref 12.0–15.0)
MCH: 28 pg (ref 26.0–34.0)
MCHC: 30.3 g/dL (ref 30.0–36.0)
MCV: 92.3 fL (ref 78.0–100.0)
Platelets: 47 10*3/uL — ABNORMAL LOW (ref 150–400)
RBC: 2.61 MIL/uL — ABNORMAL LOW (ref 3.87–5.11)
RDW: 24.2 % — AB (ref 11.5–15.5)
WBC: 12.1 10*3/uL — ABNORMAL HIGH (ref 4.0–10.5)

## 2017-11-10 LAB — MAGNESIUM: MAGNESIUM: 2.3 mg/dL (ref 1.7–2.4)

## 2017-11-11 LAB — RENAL FUNCTION PANEL
ALBUMIN: 2.1 g/dL — AB (ref 3.5–5.0)
ANION GAP: 12 (ref 5–15)
BUN: 157 mg/dL — ABNORMAL HIGH (ref 6–20)
CALCIUM: 8 mg/dL — AB (ref 8.9–10.3)
CO2: 22 mmol/L (ref 22–32)
Chloride: 103 mmol/L (ref 101–111)
Creatinine, Ser: 2.75 mg/dL — ABNORMAL HIGH (ref 0.44–1.00)
GFR, EST AFRICAN AMERICAN: 18 mL/min — AB (ref >60)
GFR, EST NON AFRICAN AMERICAN: 15 mL/min — AB (ref >60)
GLUCOSE: 220 mg/dL — AB (ref 65–99)
PHOSPHORUS: 6.7 mg/dL — AB (ref 2.5–4.6)
Potassium: 4.9 mmol/L (ref 3.5–5.1)
SODIUM: 137 mmol/L (ref 135–145)

## 2017-11-11 LAB — CBC
HCT: 24.2 % — ABNORMAL LOW (ref 36.0–46.0)
HEMOGLOBIN: 7.4 g/dL — AB (ref 12.0–15.0)
MCH: 27.8 pg (ref 26.0–34.0)
MCHC: 30.6 g/dL (ref 30.0–36.0)
MCV: 91 fL (ref 78.0–100.0)
Platelets: 55 10*3/uL — ABNORMAL LOW (ref 150–400)
RBC: 2.66 MIL/uL — ABNORMAL LOW (ref 3.87–5.11)
RDW: 24.1 % — AB (ref 11.5–15.5)
WBC: 17.1 10*3/uL — ABNORMAL HIGH (ref 4.0–10.5)

## 2017-11-11 LAB — PROTIME-INR
INR: 1.68
PROTHROMBIN TIME: 19.6 s — AB (ref 11.4–15.2)

## 2017-11-11 LAB — URINE CULTURE: Culture: 40000 — AB

## 2017-11-11 LAB — MAGNESIUM: MAGNESIUM: 2.4 mg/dL (ref 1.7–2.4)

## 2017-11-12 LAB — CULTURE, RESPIRATORY W GRAM STAIN

## 2017-11-12 LAB — CULTURE, RESPIRATORY

## 2017-12-07 DEATH — deceased

## 2018-05-02 IMAGING — CT CT CHEST W/O CM
2 of 3 series · 15 of 36 positions shown, 18 images · non-contrast
Comparison: Multiple chest radiographs including back to
10/26/2017. No prior CT.

CLINICAL DATA: Found unresponsive this morning. Admitted for
multiple rib fractures and fall. Pneumonia and ventilator dependent.
Tracheostomy yesterday. Pulmonary edema.

EXAM:
CT CHEST WITHOUT CONTRAST
TECHNIQUE: Multidetector CT imaging of the chest was performed following the
standard protocol without IV contrast.

[Series 3: chest w/o 2mm st · axial · non-contrast · 0.62mm/px · z∈[-500,-246]mm · 12 of 149 slices shown, 15 images]
[im 11/149  mediastinal]
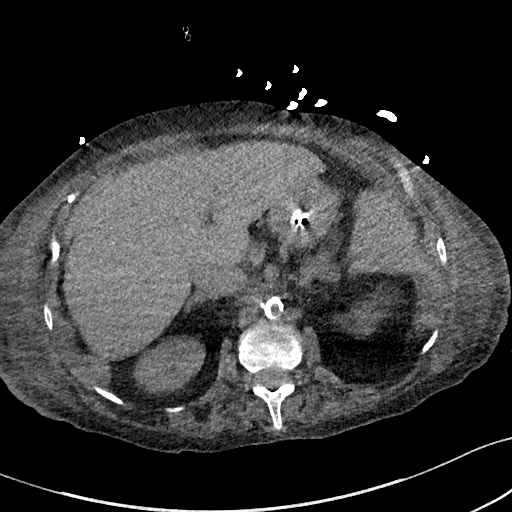
[im 11/149  lung]
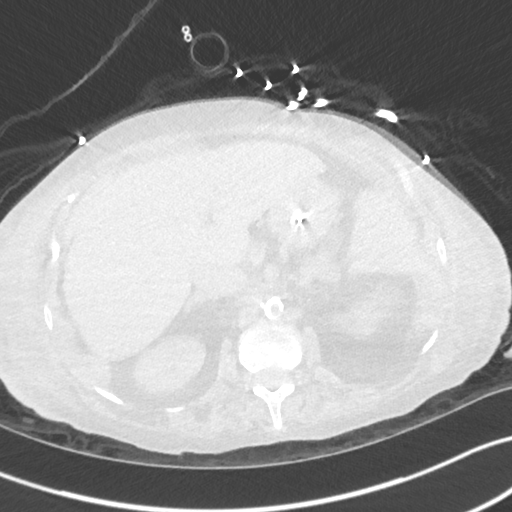
[im 22/149  lung]
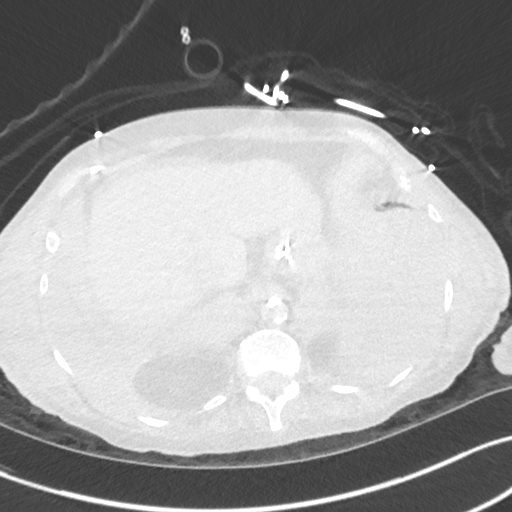
[im 33/149  lung]
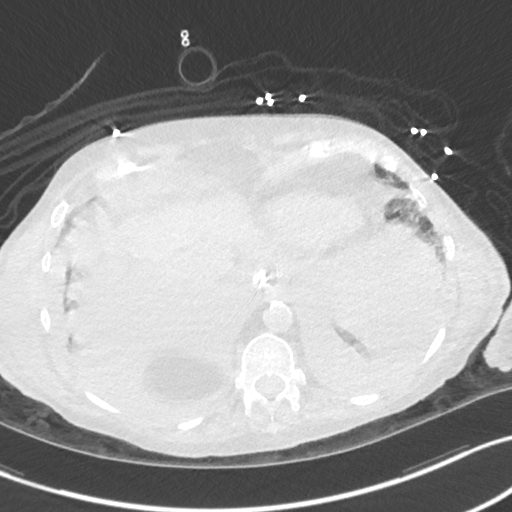
[im 44/149  lung]
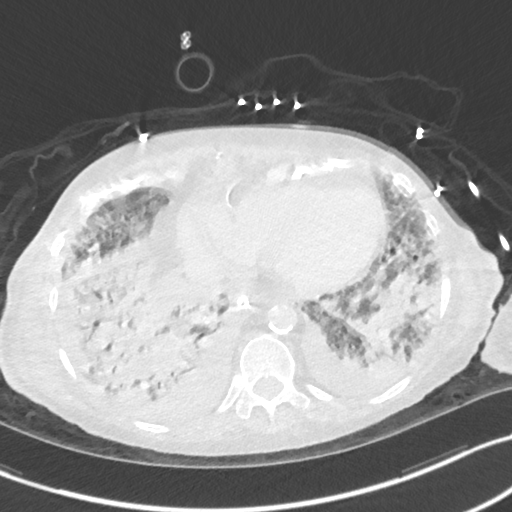
[im 55/149  mediastinal]
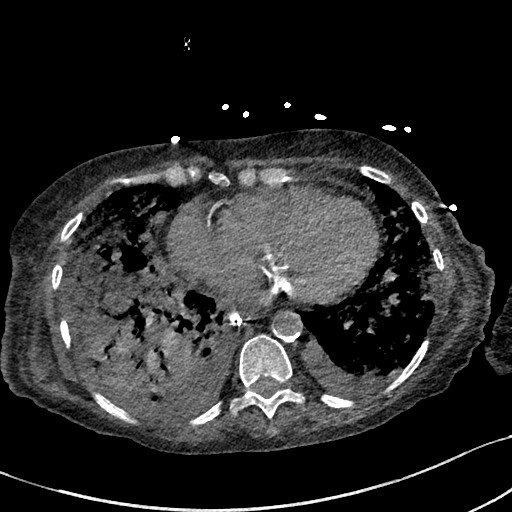
[im 55/149  lung]
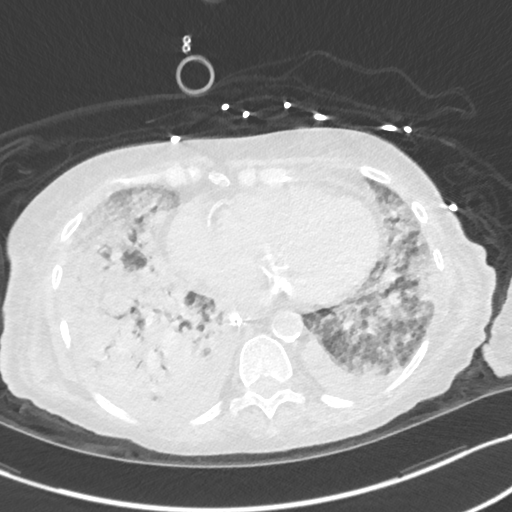
[im 66/149  lung]
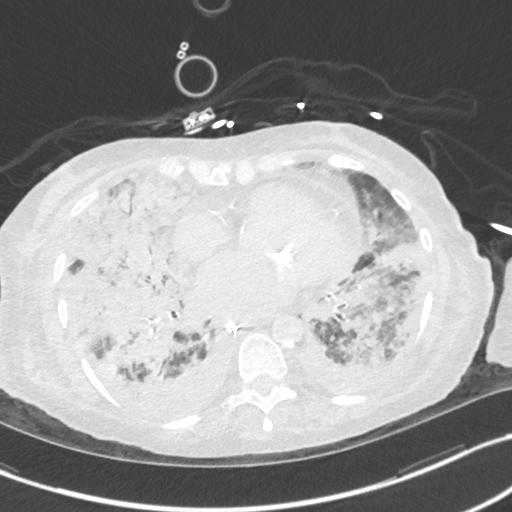
[im 83/149  lung]
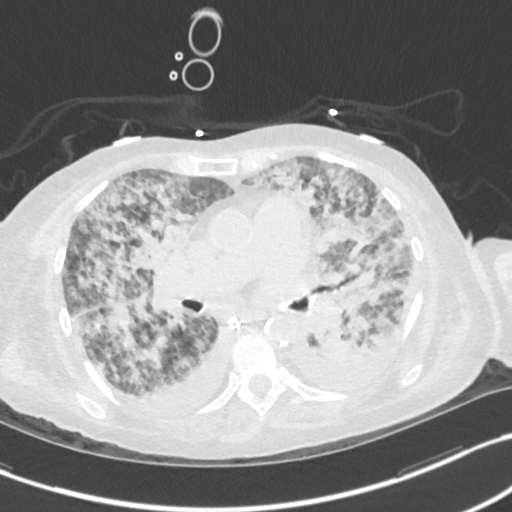
[im 94/149  lung]
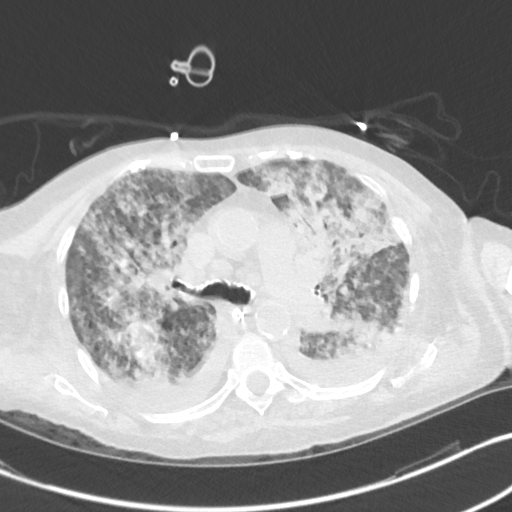
[im 105/149  mediastinal]
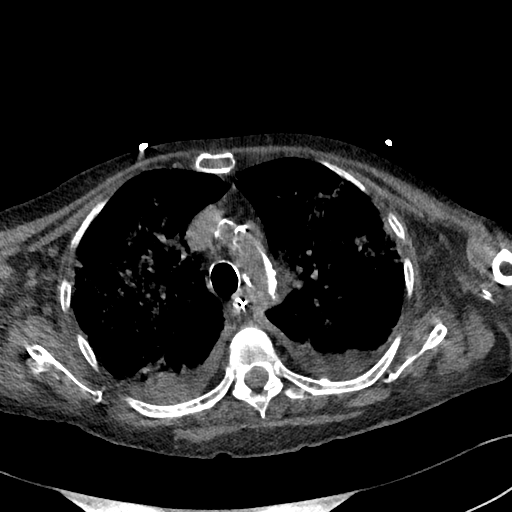
[im 105/149  lung]
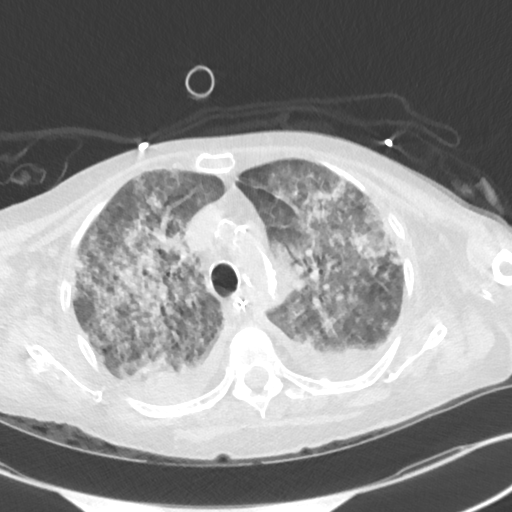
[im 116/149  lung]
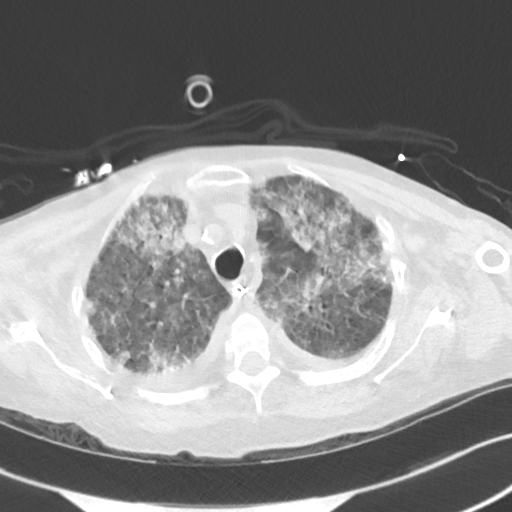
[im 127/149  lung]
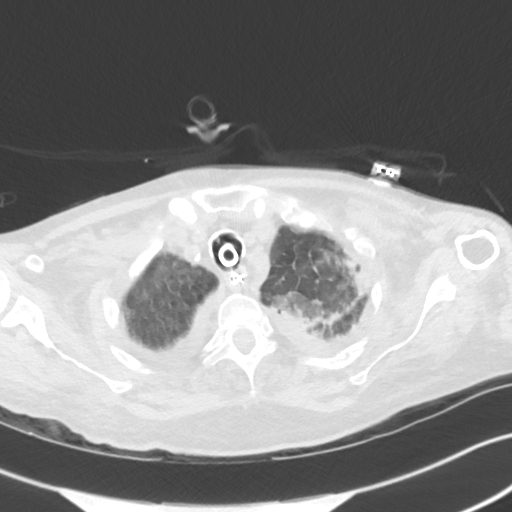
[im 138/149  lung]
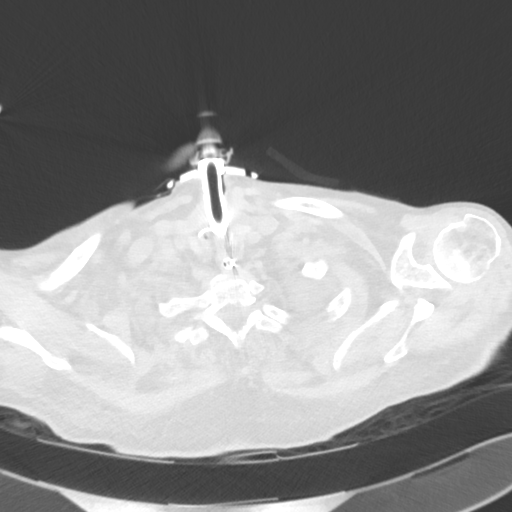

[Series 4: chest w/o 3mm st cor · coronal · non-contrast · 0.59mm/px · 3 of 62 slices shown]
[im 13/62  lung]
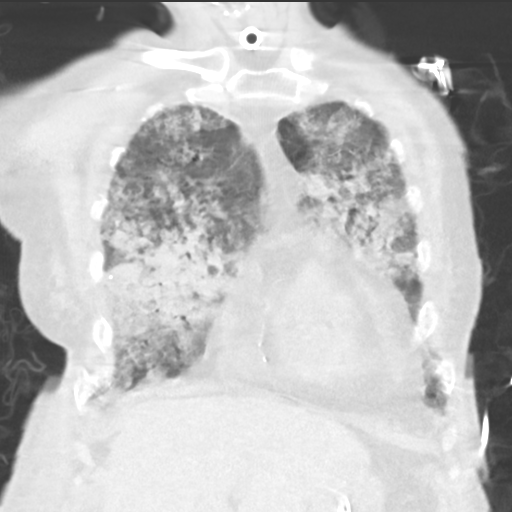
[im 25/62  lung]
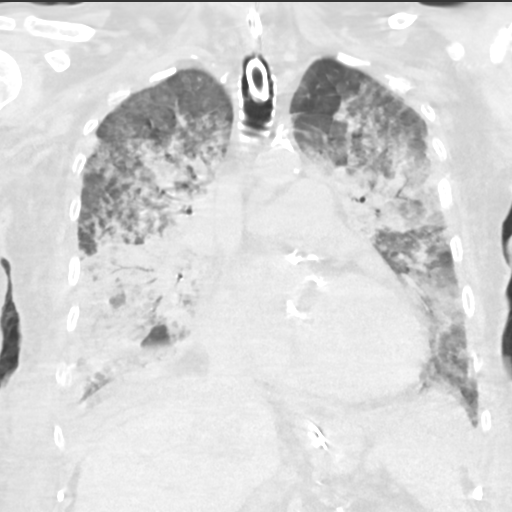
[im 37/62  lung]
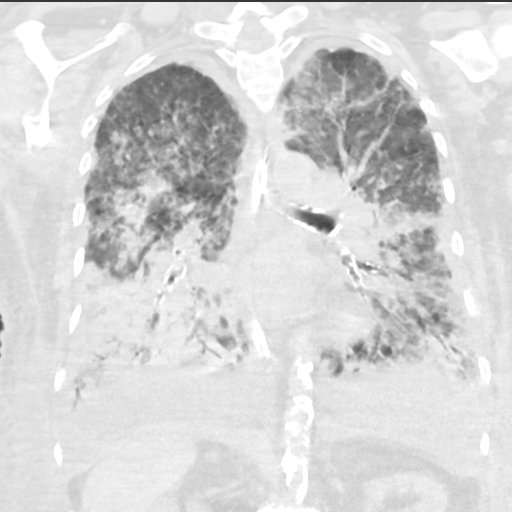

[15 of 36 positions shown; findings below may reference images not displayed]

FINDINGS: Cardiovascular: Mild degradation secondary to motion and patient arm
position, not raised above the head. Advanced aortic and branch
vessel atherosclerosis. Moderate cardiomegaly, without pericardial
effusion. Multivessel coronary artery atherosclerosis. Pulmonary
artery enlargement, outflow tract 3.2 cm.

Mediastinum/Nodes: Right supraclavicular node of 10 mm on image
[DATE].

A right subpectoral node measures 11 mm on image [DATE]. Left
paratracheal node measures 12 mm on image 55/3. Subcarinal node of
1.4 cm on image 64/3. Hilar regions poorly evaluated without
intravenous contrast.

Lungs/Pleura: Small bilateral pleural effusions are simple in
appearance.

Endotracheal tube appropriately positioned. Basilar predominant, but
relatively diffuse airspace and less so ground-glass opacity. No
pneumothorax.

Upper Abdomen: Deferred to the abdominal CT dictated separately.

Musculoskeletal: Anasarca. Multiple left-sided rib fractures,
including an acute or subacute eighth posterolateral left rib
fracture.

Severe compression deformity at T5 with focal kyphosis and mild
ventral canal encroachment.
IMPRESSION: 1. Motion and patient position degradation.
2. Diffuse but basilar predominant airspace and ground-glass
opacity. Concurrent small bilateral pleural effusions. Favor
multifocal pneumonia or aspiration. Concurrent pulmonary edema
and/or developing ARDS cannot be excluded.
3. Cardiomegaly. Coronary artery atherosclerosis. Aortic
Atherosclerosis ([id number]-AO6.6).
4. Pulmonary artery enlargement suggests pulmonary arterial
hypertension.
5. Mild thoracic and right supraclavicular adenopathy. This may be
reactive. Consider CT follow-up at 3 months to confirm resolution.
6. Severe T5 compression deformity with focal kyphosis and ventral
canal encroachment.
7. Please see abdominal CT, dictated separately.

## 2023-12-19 ENCOUNTER — Other Ambulatory Visit (HOSPITAL_COMMUNITY): Payer: Self-pay | Admitting: Internal Medicine

## 2023-12-19 ENCOUNTER — Encounter (HOSPITAL_COMMUNITY): Payer: Self-pay | Admitting: Internal Medicine

## 2023-12-19 DIAGNOSIS — R52 Pain, unspecified: Secondary | ICD-10-CM
